# Patient Record
Sex: Female | Born: 1935 | Race: White | Hispanic: No | Smoking: Never smoker
Health system: Southern US, Community
[De-identification: ages and names within clinical notes are randomized; demographics above are authoritative.]

## PROBLEM LIST (undated history)

## (undated) DIAGNOSIS — R109 Unspecified abdominal pain: Secondary | ICD-10-CM

## (undated) DIAGNOSIS — K5730 Diverticulosis of large intestine without perforation or abscess without bleeding: Secondary | ICD-10-CM

## (undated) DIAGNOSIS — K648 Other hemorrhoids: Secondary | ICD-10-CM

## (undated) DIAGNOSIS — I10 Essential (primary) hypertension: Secondary | ICD-10-CM

## (undated) DIAGNOSIS — I341 Nonrheumatic mitral (valve) prolapse: Secondary | ICD-10-CM

## (undated) DIAGNOSIS — E785 Hyperlipidemia, unspecified: Secondary | ICD-10-CM

## (undated) DIAGNOSIS — K219 Gastro-esophageal reflux disease without esophagitis: Secondary | ICD-10-CM

## (undated) DIAGNOSIS — K449 Diaphragmatic hernia without obstruction or gangrene: Secondary | ICD-10-CM

## (undated) HISTORY — DX: Diaphragmatic hernia without obstruction or gangrene: K44.9

## (undated) HISTORY — DX: Essential (primary) hypertension: I10

## (undated) HISTORY — DX: Unspecified abdominal pain: R10.9

## (undated) HISTORY — PX: OVARY SURGERY: SHX727

## (undated) HISTORY — DX: Diverticulosis of large intestine without perforation or abscess without bleeding: K57.30

## (undated) HISTORY — DX: Hyperlipidemia, unspecified: E78.5

## (undated) HISTORY — PX: MELANOMA EXCISION: SHX5266

## (undated) HISTORY — DX: Other hemorrhoids: K64.8

## (undated) HISTORY — DX: Gastro-esophageal reflux disease without esophagitis: K21.9

## (undated) HISTORY — PX: TONSILLECTOMY: SUR1361

---

## 2010-06-29 DIAGNOSIS — K5730 Diverticulosis of large intestine without perforation or abscess without bleeding: Secondary | ICD-10-CM

## 2010-06-29 DIAGNOSIS — K219 Gastro-esophageal reflux disease without esophagitis: Secondary | ICD-10-CM

## 2010-06-29 DIAGNOSIS — K648 Other hemorrhoids: Secondary | ICD-10-CM

## 2010-06-29 DIAGNOSIS — K449 Diaphragmatic hernia without obstruction or gangrene: Secondary | ICD-10-CM

## 2010-06-29 HISTORY — DX: Diverticulosis of large intestine without perforation or abscess without bleeding: K57.30

## 2010-06-29 HISTORY — DX: Gastro-esophageal reflux disease without esophagitis: K21.9

## 2010-06-29 HISTORY — DX: Other hemorrhoids: K64.8

## 2010-06-29 HISTORY — DX: Diaphragmatic hernia without obstruction or gangrene: K44.9

## 2011-06-30 HISTORY — PX: CHOLECYSTECTOMY: SHX55

## 2012-02-04 ENCOUNTER — Emergency Department (HOSPITAL_BASED_OUTPATIENT_CLINIC_OR_DEPARTMENT_OTHER): Payer: Medicare Other

## 2012-02-04 ENCOUNTER — Emergency Department (HOSPITAL_BASED_OUTPATIENT_CLINIC_OR_DEPARTMENT_OTHER)
Admission: EM | Admit: 2012-02-04 | Discharge: 2012-02-04 | Disposition: A | Discharge status: Home / Self Care | Payer: Medicare Other | Attending: Emergency Medicine | Admitting: Emergency Medicine

## 2012-02-04 ENCOUNTER — Encounter (HOSPITAL_BASED_OUTPATIENT_CLINIC_OR_DEPARTMENT_OTHER): Payer: Self-pay | Admitting: *Deleted

## 2012-02-04 DIAGNOSIS — R350 Frequency of micturition: Secondary | ICD-10-CM | POA: Insufficient documentation

## 2012-02-04 DIAGNOSIS — R112 Nausea with vomiting, unspecified: Secondary | ICD-10-CM | POA: Insufficient documentation

## 2012-02-04 DIAGNOSIS — R109 Unspecified abdominal pain: Secondary | ICD-10-CM | POA: Insufficient documentation

## 2012-02-04 DIAGNOSIS — N390 Urinary tract infection, site not specified: Secondary | ICD-10-CM | POA: Insufficient documentation

## 2012-02-04 DIAGNOSIS — R1110 Vomiting, unspecified: Secondary | ICD-10-CM

## 2012-02-04 DIAGNOSIS — Z9089 Acquired absence of other organs: Secondary | ICD-10-CM | POA: Insufficient documentation

## 2012-02-04 DIAGNOSIS — R12 Heartburn: Secondary | ICD-10-CM | POA: Insufficient documentation

## 2012-02-04 DIAGNOSIS — R6883 Chills (without fever): Secondary | ICD-10-CM | POA: Insufficient documentation

## 2012-02-04 DIAGNOSIS — R10816 Epigastric abdominal tenderness: Secondary | ICD-10-CM | POA: Insufficient documentation

## 2012-02-04 HISTORY — DX: Nonrheumatic mitral (valve) prolapse: I34.1

## 2012-02-04 LAB — URINALYSIS, ROUTINE W REFLEX MICROSCOPIC
Bilirubin Urine: NEGATIVE
Nitrite: NEGATIVE
Specific Gravity, Urine: 1.019 (ref 1.005–1.030)
Urobilinogen, UA: 0.2 mg/dL (ref 0.0–1.0)

## 2012-02-04 LAB — COMPREHENSIVE METABOLIC PANEL
Alkaline Phosphatase: 54 U/L (ref 39–117)
BUN: 36 mg/dL — ABNORMAL HIGH (ref 6–23)
Calcium: 11.1 mg/dL — ABNORMAL HIGH (ref 8.4–10.5)
Creatinine, Ser: 1.3 mg/dL — ABNORMAL HIGH (ref 0.50–1.10)
GFR calc Af Amer: 43 mL/min — ABNORMAL LOW (ref >90)
Glucose, Bld: 151 mg/dL — ABNORMAL HIGH (ref 70–99)
Total Protein: 7.5 g/dL (ref 6.0–8.3)

## 2012-02-04 LAB — CBC WITH DIFFERENTIAL/PLATELET
Basophils Relative: 0 % (ref 0–1)
Eosinophils Absolute: 0.1 10*3/uL (ref 0.0–0.7)
Eosinophils Relative: 1 % (ref 0–5)
Hemoglobin: 14.4 g/dL (ref 12.0–15.0)
Lymphs Abs: 1.8 10*3/uL (ref 0.7–4.0)
MCH: 31.6 pg (ref 26.0–34.0)
MCHC: 36.3 g/dL — ABNORMAL HIGH (ref 30.0–36.0)
MCV: 87.1 fL (ref 78.0–100.0)
Monocytes Relative: 6 % (ref 3–12)
RBC: 4.56 MIL/uL (ref 3.87–5.11)

## 2012-02-04 LAB — URINE MICROSCOPIC-ADD ON

## 2012-02-04 LAB — LIPASE, BLOOD: Lipase: 26 U/L (ref 11–59)

## 2012-02-04 MED ORDER — PANTOPRAZOLE SODIUM 40 MG IV SOLR
40.0000 mg | Freq: Once | INTRAVENOUS | Status: AC
  Administered 2012-02-04: 40 mg via INTRAVENOUS
  Filled 2012-02-04: qty 40

## 2012-02-04 MED ORDER — CIPROFLOXACIN HCL 500 MG PO TABS: 500.0000 mg | ORAL_TABLET | Freq: Two times a day (BID) | ORAL | Status: AC

## 2012-02-04 MED ORDER — ONDANSETRON 4 MG PO TBDP: 4.0000 mg | ORAL_TABLET | Freq: Once | ORAL | Status: DC

## 2012-02-04 MED ORDER — ONDANSETRON 8 MG PO TBDP: 8.0000 mg | ORAL_TABLET | Freq: Three times a day (TID) | ORAL | Status: AC | PRN

## 2012-02-04 MED ORDER — ONDANSETRON HCL 4 MG/2ML IJ SOLN
4.0000 mg | Freq: Once | INTRAMUSCULAR | Status: AC
  Administered 2012-02-04: 4 mg via INTRAVENOUS

## 2012-02-04 MED ORDER — CIPROFLOXACIN IN D5W 400 MG/200ML IV SOLN
400.0000 mg | Freq: Once | INTRAVENOUS | Status: AC
  Administered 2012-02-04: 400 mg via INTRAVENOUS
  Filled 2012-02-04: qty 200

## 2012-02-04 MED ORDER — ONDANSETRON HCL 4 MG/2ML IJ SOLN
INTRAMUSCULAR | Status: AC
  Administered 2012-02-04: 4 mg via INTRAVENOUS
  Filled 2012-02-04: qty 2

## 2012-02-04 NOTE — ED Notes (Signed)
Pt. Reports mid abd. Pain and started vomiting today at 3pm.  Pt. Reports eating chinese food last night.

## 2012-02-04 NOTE — ED Notes (Signed)
Pt in XR at this time

## 2012-02-04 NOTE — ED Provider Notes (Signed)
Elderly female with a history of intermittent episodes of abdominal pain with nausea and vomiting who presents with similar symptoms. She denies any fevers chills coughing diarrhea rashes or swelling. On exam the patient is a soft abdomen which is minimally tender, moist mucous membranes after getting IV fluids and anti-emetics. Review of the patient's vital signs shows no fever, no significant tachycardia or hypotension. Her imaging reveals a very early ileus, the patient is a symptomatically medications, has a urinary tract infection and desires to be discharged rather than being admitted when offered. She appears stable for discharge and appears responsible and has a family member that is with her that can bring her back should her symptoms worsen.  Medical screening examination/treatment/procedure(s) were conducted as a shared visit with non-physician practitioner(s) and myself.  I personally evaluated the patient during the encounter    Vida Roller, MD 02/04/12 2107

## 2012-02-04 NOTE — ED Provider Notes (Signed)
History     CSN: [id number]  Arrival date & time 02/04/12  1802   First MD Initiated Contact with Patient 02/04/12 1841      Chief Complaint  Patient presents with  . Abdominal Pain    Pt. c/o mid abd. pain starting approx. this am and started vomiting at 3pm today.  No reports of diarrhea.  Pt. reports constant vomiting since 3pm    (Consider location/radiation/quality/duration/timing/severity/associated sxs/prior treatment) Patient is a 78 y.o. female presenting with abdominal pain. The history is provided by the patient.  Abdominal Pain The primary symptoms of the illness include abdominal pain, nausea and vomiting. The primary symptoms of the illness do not include fever, diarrhea, hematemesis or dysuria. The current episode started 6 to 12 hours ago. The onset of the illness was sudden. The problem has not changed since onset. Additional symptoms associated with the illness include chills, heartburn and frequency. Symptoms associated with the illness do not include anorexia, urgency or back pain.  Pt states epigastric abdominal pain onset this morning. Nusea, persistent vomiting starting around 3pm. Pt states hx of the same. Has been having these problems for "years" with multiple hospitalizations and tests including CT scans, MRIs, labs. States last hospitalized in may of this year. States usually comes in for 4 days for IV fluids and anti emetics. Denies fever, chills, diarrhea. No chest pain or SOB.    Past Medical History  Diagnosis Date  . Mitral prolapse     Past Surgical History  Procedure Date  . Melanoma excision   . Cholecystectomy   . Ovary surgery     No family history on file.  History  Substance Use Topics  . Smoking status: Not on file  . Smokeless tobacco: Not on file  . Alcohol Use:     OB History    Grav Para Term Preterm Abortions TAB SAB Ect Mult Living                  Review of Systems  Constitutional: Positive for chills. Negative for  fever.  HENT: Negative for neck pain and neck stiffness.   Respiratory: Negative.   Cardiovascular: Negative.   Gastrointestinal: Positive for heartburn, nausea, vomiting and abdominal pain. Negative for diarrhea, blood in stool, anorexia and hematemesis.  Genitourinary: Positive for frequency. Negative for dysuria and urgency.  Musculoskeletal: Negative for back pain.  Skin: Negative.   Neurological: Negative for dizziness, weakness and numbness.    Allergies  Penicillins and Sulfa antibiotics  Home Medications   Current Outpatient Rx  Name Route Sig Dispense Refill  . ATORVASTATIN CALCIUM 10 MG PO TABS Oral Take 5 mg by mouth.    Marland Kitchen HYDROCHLOROTHIAZIDE 25 MG PO TABS Oral Take 25 mg by mouth daily.    Marland Kitchen LISINOPRIL 10 MG PO TABS Oral Take 10 mg by mouth daily.    Marland Kitchen METOPROLOL TARTRATE 25 MG PO TABS Oral Take 25 mg by mouth 2 (two) times daily.      BP 134/59  Pulse 102  Temp 98 F (36.7 C) (Oral)  Resp 18  SpO2 100%  Physical Exam  Nursing note and vitals reviewed. Constitutional: She is oriented to person, place, and time. She appears well-developed and well-nourished. No distress.  HENT:  Head: Normocephalic.  Eyes: Conjunctivae are normal.  Neck: Normal range of motion. Neck supple.  Cardiovascular: Normal rate, regular rhythm and normal heart sounds.   Pulmonary/Chest: Effort normal and breath sounds normal. No respiratory distress. She has no  wheezes.  Abdominal: Soft. Bowel sounds are normal.       Epigastric tenderness. No guarding  Musculoskeletal: She exhibits no edema.  Neurological: She is alert and oriented to person, place, and time.  Skin: Skin is warm and dry.  Psychiatric: She has a normal mood and affect.    ED Course  Procedures (including critical care time) Pt with similar symptoms in the past. Fluids started. Labs pending. ECG ordered. Zofran given by a nurse, she is feeling better currently. Will monitor.   Results for orders placed during the  hospital encounter of 02/04/12  CBC WITH DIFFERENTIAL      Component Value Range   WBC 12.7 (*) 4.0 - 10.5 K/uL   RBC 4.56  3.87 - 5.11 MIL/uL   Hemoglobin 14.4  12.0 - 15.0 g/dL   HCT 08.6  57.8 - 46.9 %   MCV 87.1  78.0 - 100.0 fL   MCH 31.6  26.0 - 34.0 pg   MCHC 36.3 (*) 30.0 - 36.0 g/dL   RDW 62.9  52.8 - 41.3 %   Platelets 311  150 - 400 K/uL   Neutrophils Relative 79 (*) 43 - 77 %   Neutro Abs 10.0 (*) 1.7 - 7.7 K/uL   Lymphocytes Relative 15  12 - 46 %   Lymphs Abs 1.8  0.7 - 4.0 K/uL   Monocytes Relative 6  3 - 12 %   Monocytes Absolute 0.8  0.1 - 1.0 K/uL   Eosinophils Relative 1  0 - 5 %   Eosinophils Absolute 0.1  0.0 - 0.7 K/uL   Basophils Relative 0  0 - 1 %   Basophils Absolute 0.0  0.0 - 0.1 K/uL  COMPREHENSIVE METABOLIC PANEL      Component Value Range   Sodium 138  135 - 145 mEq/L   Potassium 4.0  3.5 - 5.1 mEq/L   Chloride 94 (*) 96 - 112 mEq/L   CO2 32  19 - 32 mEq/L   Glucose, Bld 151 (*) 70 - 99 mg/dL   BUN 36 (*) 6 - 23 mg/dL   Creatinine, Ser 2.44 (*) 0.50 - 1.10 mg/dL   Calcium 01.0 (*) 8.4 - 10.5 mg/dL   Total Protein 7.5  6.0 - 8.3 g/dL   Albumin 4.7  3.5 - 5.2 g/dL   AST 17  0 - 37 U/L   ALT 8  0 - 35 U/L   Alkaline Phosphatase 54  39 - 117 U/L   Total Bilirubin 0.5  0.3 - 1.2 mg/dL   GFR calc non Af Amer 37 (*) >90 mL/min   GFR calc Af Amer 43 (*) >90 mL/min  LIPASE, BLOOD      Component Value Range   Lipase 26  11 - 59 U/L  URINALYSIS, ROUTINE W REFLEX MICROSCOPIC      Component Value Range   Color, Urine YELLOW  YELLOW   APPearance CLOUDY (*) CLEAR   Specific Gravity, Urine 1.019  1.005 - 1.030   pH 7.5  5.0 - 8.0   Glucose, UA NEGATIVE  NEGATIVE mg/dL   Hgb urine dipstick NEGATIVE  NEGATIVE   Bilirubin Urine NEGATIVE  NEGATIVE   Ketones, ur NEGATIVE  NEGATIVE mg/dL   Protein, ur NEGATIVE  NEGATIVE mg/dL   Urobilinogen, UA 0.2  0.0 - 1.0 mg/dL   Nitrite NEGATIVE  NEGATIVE   Leukocytes, UA SMALL (*) NEGATIVE  TROPONIN I       Component Value Range   Troponin  I <0.30  <0.30 ng/mL  URINE MICROSCOPIC-ADD ON      Component Value Range   Squamous Epithelial / LPF RARE  RARE   WBC, UA 7-10  <3 WBC/hpf   Bacteria, UA MANY (*) RARE   Dg Abd Acute W/chest  02/04/2012  *RADIOLOGY REPORT*  Clinical Data: Vomiting.  Previous cholecystectomy.  ACUTE ABDOMEN SERIES (ABDOMEN 2 VIEW & CHEST 1 VIEW)  Comparison: None.  Findings: Normal sized heart.  Clear lungs.  Mild diffuse peribronchial thickening.  Borderline dilated small bowel loops.  No free peritoneal air. Lower lumbar spine degenerative changes.  IMPRESSION:  1.  Minimal small bowel ileus or very early partial obstruction. 2.  Mild chronic bronchitic changes.  Original Report Authenticated By: Darrol Angel, M.D.    Date: 02/04/2012  Rate: 87  Rhythm: normal sinus rhythm  QRS Axis: normal  Intervals: normal  ST/T Wave abnormalities: normal  Conduction Disutrbances:none  Narrative Interpretation: Low voltage QRS, flattened T waves  Old EKG Reviewed: none available   Pt feeling better. Wanting to go home. Cipro given for UTI, penicillin allergy. Discussed with dr. Hyacinth Meeker, d/c, return if worsening.    No results found.   1. UTI (lower urinary tract infection)   2. Abdominal pain   3. Vomiting       MDM  Pt with abdominal pain and vomiting onset today. Similar episodes in the past, evaluated in [place]. Labs obtained, x-ray to r/o obstruction. Zofran for nausea, protonix for reflux symptoms. Pt has uti, cipro given, pt no longer having vomiting. Wanting to go home. Pt does have possible illeus vs partial SBO. Pt does not want to stay and her symptoms improved. Will d/c home, however, told to return if worsening.        Lottie Mussel, PA 02/04/12 2211  Lottie Mussel, PA 02/04/12 2231

## 2012-02-05 LAB — URINE CULTURE

## 2012-02-05 NOTE — ED Provider Notes (Signed)
Medical screening examination/treatment/procedure(s) were conducted as a shared visit with non-physician practitioner(s) and myself.  I personally evaluated the patient during the encounter  Please see my separate respective documentation pertaining to this patient encounter   Vida Roller, MD 02/05/12 [phone]

## 2012-05-09 ENCOUNTER — Encounter: Payer: Self-pay | Admitting: *Deleted

## 2012-05-09 DIAGNOSIS — I341 Nonrheumatic mitral (valve) prolapse: Secondary | ICD-10-CM | POA: Insufficient documentation

## 2012-05-10 ENCOUNTER — Encounter: Payer: Self-pay | Admitting: Cardiology

## 2012-05-10 ENCOUNTER — Encounter: Payer: Self-pay | Admitting: *Deleted

## 2012-05-10 DIAGNOSIS — R109 Unspecified abdominal pain: Secondary | ICD-10-CM | POA: Insufficient documentation

## 2012-05-10 DIAGNOSIS — N390 Urinary tract infection, site not specified: Secondary | ICD-10-CM | POA: Insufficient documentation

## 2012-05-11 ENCOUNTER — Ambulatory Visit (INDEPENDENT_AMBULATORY_CARE_PROVIDER_SITE_OTHER): Payer: Medicare Other | Admitting: Cardiology

## 2012-05-11 ENCOUNTER — Encounter: Payer: Self-pay | Admitting: Cardiology

## 2012-05-11 VITALS — BP 126/80 | HR 76 | Wt 124.0 lb

## 2012-05-11 DIAGNOSIS — R002 Palpitations: Secondary | ICD-10-CM

## 2012-05-11 DIAGNOSIS — I341 Nonrheumatic mitral (valve) prolapse: Secondary | ICD-10-CM

## 2012-05-11 DIAGNOSIS — I059 Rheumatic mitral valve disease, unspecified: Secondary | ICD-10-CM

## 2012-05-11 DIAGNOSIS — I10 Essential (primary) hypertension: Secondary | ICD-10-CM

## 2012-05-11 NOTE — Patient Instructions (Addendum)
Your physician recommends that you schedule a follow-up appointment in: as needed  

## 2012-05-11 NOTE — Assessment & Plan Note (Signed)
Patient feels occasional skips but no sustained palpitations. Her symptoms appear to be reasonably well controlled with beta-blockade. Will continue. Note previous echocardiogram showed normal LV function and previous stress echocardiogram was normal.

## 2012-05-11 NOTE — Assessment & Plan Note (Signed)
Blood pressure is controlled. Continue present medications. 

## 2012-05-11 NOTE — Progress Notes (Signed)
  HPI: 78 year old female for evaluation of hypertension. Patient recently moved to this area from [place]. She was seen there by cardiology for history of palpitations secondary to PVCs, question history of mitral valve prolapse as well as hypertension. She did bring records today. She had a stress echocardiogram last in January of 2009 that was normal. An echocardiogram from October of 2007 showed normal LV function and no significant mitral valve prolapse. There was trace mitral regurgitation. Patient denies dyspnea on exertion, orthopnea, PND, pedal edema, syncope or chest pain. She occasionally feels brief skips but no sustained palpitations.  Current Outpatient Prescriptions  Medication Sig Dispense Refill  . atorvastatin (LIPITOR) 10 MG tablet Take 5 mg by mouth. Patient uses 5 mg of this medication.      . hydrochlorothiazide (HYDRODIURIL) 25 MG tablet Take 25 mg by mouth daily.      Marland Kitchen lisinopril (PRINIVIL,ZESTRIL) 10 MG tablet Take 10 mg by mouth daily.      . metoprolol tartrate (LOPRESSOR) 25 MG tablet Take 25 mg by mouth daily.         Allergies  Allergen Reactions  . Penicillins Anaphylaxis  . Sulfa Antibiotics Rash    Past Medical History  Diagnosis Date  . Mitral prolapse   . Abdominal pain   . Hypertension   . Hyperlipidemia   . GERD (gastroesophageal reflux disease)     Past Surgical History  Procedure Date  . Melanoma excision   . Cholecystectomy   . Ovary surgery   . Tonsillectomy     History   Social History  . Marital Status: Widowed    Spouse Name: N/A    Number of Children: 3  . Years of Education: N/A   Occupational History  . Not on file.   Social History Main Topics  . Smoking status: Never Smoker   . Smokeless tobacco: Not on file  . Alcohol Use: Yes     Comment: Occasional glass of wine  . Drug Use: No  . Sexually Active: Not on file   Other Topics Concern  . Not on file   Social History Narrative  . No narrative on file     Family History  Problem Relation Age of Onset  . Heart disease      No family history    ROS: knee arthralgias but no fevers or chills, productive cough, hemoptysis, dysphasia, odynophagia, melena, hematochezia, dysuria, hematuria, rash, seizure activity, orthopnea, PND, pedal edema, claudication. Remaining systems are negative.  Physical Exam:   Blood pressure 126/80, pulse 76, weight 124 lb (56.246 kg).  General:  Well developed/well nourished in NAD Skin warm/dry Patient not depressed No peripheral clubbing Back-normal HEENT-normal/normal eyelids Neck supple/normal carotid upstroke bilaterally; no bruits; no JVD; no thyromegaly chest - CTA/ normal expansion CV - RRR/normal S1 and S2; no murmurs, rubs or gallops;  PMI nondisplaced Abdomen -NT/ND, no HSM, no mass, + bowel sounds, no bruit 2+ femoral pulses, no bruits Ext-no edema, chords, 2+ DP Neuro-grossly nonfocal  ECG sinus rhythm at a rate of 76. No ST changes. Low voltage.

## 2012-05-11 NOTE — Assessment & Plan Note (Signed)
No evidence on most recent echocardiogram. 

## 2012-05-18 ENCOUNTER — Encounter: Payer: Self-pay | Admitting: Cardiology

## 2012-12-24 ENCOUNTER — Encounter (HOSPITAL_BASED_OUTPATIENT_CLINIC_OR_DEPARTMENT_OTHER): Payer: Self-pay | Admitting: *Deleted

## 2012-12-24 ENCOUNTER — Emergency Department (HOSPITAL_BASED_OUTPATIENT_CLINIC_OR_DEPARTMENT_OTHER)
Admission: EM | Admit: 2012-12-24 | Discharge: 2012-12-24 | Disposition: A | Discharge status: Home / Self Care | Payer: Medicare Other | Attending: Emergency Medicine | Admitting: Emergency Medicine

## 2012-12-24 ENCOUNTER — Emergency Department (HOSPITAL_BASED_OUTPATIENT_CLINIC_OR_DEPARTMENT_OTHER): Payer: Medicare Other

## 2012-12-24 DIAGNOSIS — E785 Hyperlipidemia, unspecified: Secondary | ICD-10-CM | POA: Insufficient documentation

## 2012-12-24 DIAGNOSIS — Z79899 Other long term (current) drug therapy: Secondary | ICD-10-CM | POA: Insufficient documentation

## 2012-12-24 DIAGNOSIS — R112 Nausea with vomiting, unspecified: Secondary | ICD-10-CM | POA: Insufficient documentation

## 2012-12-24 DIAGNOSIS — Z8679 Personal history of other diseases of the circulatory system: Secondary | ICD-10-CM | POA: Insufficient documentation

## 2012-12-24 DIAGNOSIS — Z880 Allergy status to penicillin: Secondary | ICD-10-CM | POA: Insufficient documentation

## 2012-12-24 DIAGNOSIS — R011 Cardiac murmur, unspecified: Secondary | ICD-10-CM | POA: Insufficient documentation

## 2012-12-24 DIAGNOSIS — Z9089 Acquired absence of other organs: Secondary | ICD-10-CM | POA: Insufficient documentation

## 2012-12-24 DIAGNOSIS — I10 Essential (primary) hypertension: Secondary | ICD-10-CM | POA: Insufficient documentation

## 2012-12-24 DIAGNOSIS — Z9889 Other specified postprocedural states: Secondary | ICD-10-CM | POA: Insufficient documentation

## 2012-12-24 DIAGNOSIS — Z8719 Personal history of other diseases of the digestive system: Secondary | ICD-10-CM | POA: Insufficient documentation

## 2012-12-24 DIAGNOSIS — R6883 Chills (without fever): Secondary | ICD-10-CM | POA: Insufficient documentation

## 2012-12-24 LAB — URINALYSIS, ROUTINE W REFLEX MICROSCOPIC
Bilirubin Urine: NEGATIVE
Ketones, ur: NEGATIVE mg/dL
Nitrite: NEGATIVE
Specific Gravity, Urine: 1.013 (ref 1.005–1.030)
Urobilinogen, UA: 0.2 mg/dL (ref 0.0–1.0)

## 2012-12-24 LAB — CBC WITH DIFFERENTIAL/PLATELET
Basophils Relative: 0 % (ref 0–1)
Eosinophils Absolute: 0.1 10*3/uL (ref 0.0–0.7)
MCH: 31.9 pg (ref 26.0–34.0)
MCHC: 35.5 g/dL (ref 30.0–36.0)
Neutrophils Relative %: 71 % (ref 43–77)
Platelets: 244 10*3/uL (ref 150–400)
RBC: 3.89 MIL/uL (ref 3.87–5.11)

## 2012-12-24 LAB — COMPREHENSIVE METABOLIC PANEL
ALT: 13 U/L (ref 0–35)
Albumin: 3.9 g/dL (ref 3.5–5.2)
Alkaline Phosphatase: 56 U/L (ref 39–117)
Potassium: 4.1 mEq/L (ref 3.5–5.1)
Sodium: 137 mEq/L (ref 135–145)
Total Protein: 6.7 g/dL (ref 6.0–8.3)

## 2012-12-24 LAB — URINE MICROSCOPIC-ADD ON

## 2012-12-24 MED ORDER — ONDANSETRON 8 MG PO TBDP: 8.0000 mg | ORAL_TABLET | Freq: Three times a day (TID) | ORAL | Status: DC | PRN

## 2012-12-24 NOTE — ED Notes (Signed)
MD at bedside. 

## 2012-12-24 NOTE — ED Notes (Signed)
Pt states that every time she eats something solid, her stomach hurts. She walks around and massages her stomach and it goes away. This has been going on for 4-5 days.

## 2012-12-24 NOTE — ED Provider Notes (Signed)
History    This chart was scribed for Heather Quarry, MD by Quintella Reichert, ED scribe.  This patient was seen in room MH07/MH07 and the patient's care was started at 3:28 PM.   CSN: [id number]  Arrival date & time 12/24/12  1455     Chief Complaint  Patient presents with  . Abdominal Pain    Patient is a 79 y.o. female presenting with abdominal pain. The history is provided by the patient. No language interpreter was used.  Abdominal Pain This is a recurrent problem. The current episode started more than 2 days ago. Episode frequency: Every time pt eats solid foods. The problem has not changed since onset.Associated symptoms include abdominal pain. Pertinent negatives include no chest pain, no headaches and no shortness of breath. The symptoms are aggravated by eating (solid foods). Relieved by: Walking and rubbing stomach.    HPI Comments: Heather Bender is a 79 y.o. female with h/o recurrent abdominal pain who presents to the Emergency Department complaining of 4 days of episodic severe upper abdominal pain brought on by eating solid foods.  Pain is described as "hot" and causes her to double up, and is accompanied by chills.  It is relieved by walking and massaging her stomach.  It is not brought on by liquids.  Pt has h/o similar episodes intermittently for 7 years and in 2012 received an abdominal US that revealed gallstones that were treated by cholecystectomy.  This is the 3rd time she has experienced symptoms since her surgery.  She has not received an [place] since that time.  Pt notes that she normally has nausea and emesis during episodes of pain but this time she does not have either.  She notes that her BMs recently have been solid small fragments but she denies diarrhea, constipation or other bowel symptoms.  She denies dysuria, difficulty urinating, urinary frequency, rash, joint swelling, or any other associated symptoms.   PCP is Dr. Alberteen Sam   Past Medical History   Diagnosis Date  . Mitral prolapse   . Abdominal pain   . Hypertension   . Hyperlipidemia   . GERD (gastroesophageal reflux disease)    Past Surgical History  Procedure Laterality Date  . Melanoma excision    . Cholecystectomy    . Ovary surgery    . Tonsillectomy     Family History  Problem Relation Age of Onset  . Heart disease      No family history  . Cancer Father     Kidney  . Diabetes Sister   . Diabetes Maternal Aunt   . Cancer Maternal Grandmother     Colon Cancer   History  Substance Use Topics  . Smoking status: Never Smoker   . Smokeless tobacco: Not on file  . Alcohol Use: Yes     Comment: Occasional glass of wine   OB History   Grav Para Term Preterm Abortions TAB SAB Ect Mult Living                   Review of Systems  Constitutional: Positive for chills.  Respiratory: Negative for shortness of breath.   Cardiovascular: Negative for chest pain.  Gastrointestinal: Positive for abdominal pain. Negative for nausea, vomiting, diarrhea and constipation.  Genitourinary: Negative for dysuria, frequency and difficulty urinating.  Musculoskeletal: Negative for joint swelling.  Skin: Negative for rash.  Neurological: Negative for headaches.  All other systems reviewed and are negative.      Allergies  Penicillins  and Sulfa antibiotics  Home Medications   Current Outpatient Rx  Name  Route  Sig  Dispense  Refill  . lisinopril-hydrochlorothiazide (PRINZIDE,ZESTORETIC) 20-12.5 MG per tablet   Oral   Take 1 tablet by mouth daily.         Marland Kitchen atorvastatin (LIPITOR) 10 MG tablet   Oral   Take 5 mg by mouth. Patient uses 5 mg of this medication.         . hydrochlorothiazide (HYDRODIURIL) 25 MG tablet   Oral   Take 25 mg by mouth daily.         Marland Kitchen lisinopril (PRINIVIL,ZESTRIL) 10 MG tablet   Oral   Take 10 mg by mouth daily.         . metoprolol tartrate (LOPRESSOR) 25 MG tablet   Oral   Take 25 mg by mouth daily.           BP  117/55  Pulse 85  Temp(Src) 98.3 F (36.8 C) (Oral)  Resp 18  Ht 5\' 3"  (1.6 m)  Wt 120 lb (54.432 kg)  BMI 21.26 kg/m2  SpO2 100%  Physical Exam  Nursing note and vitals reviewed. Constitutional: She is oriented to person, place, and time. She appears well-developed and well-nourished.  HENT:  Head: Normocephalic and atraumatic.  Right Ear: External ear normal.  Left Ear: External ear normal.  Nose: Nose normal.  Mouth/Throat: Oropharynx is clear and moist.  Eyes: Conjunctivae and EOM are normal. Pupils are equal, round, and reactive to light.  Neck: Normal range of motion. Neck supple.  Cardiovascular: Normal rate, regular rhythm and intact distal pulses.   HR 72 and regular 2/6 systolic murmur  Pulmonary/Chest: Effort normal and breath sounds normal.  Lungs clear to auscultation, good air movement  Abdominal: Soft. Bowel sounds are normal. She exhibits no distension and no mass. There is no tenderness. There is no rebound and no guarding.  No organomegaly  Genitourinary:  No CVA tenderness  Musculoskeletal: Normal range of motion.  Neurological: She is alert and oriented to person, place, and time. She has normal reflexes.  Skin: Skin is warm and dry.  Psychiatric: She has a normal mood and affect. Her behavior is normal. Judgment and thought content normal.    ED Course  Procedures (including critical care time)  DIAGNOSTIC STUDIES: Oxygen Saturation is 100% on room air, normal by my interpretation.    COORDINATION OF CARE: 3:37 PM-Discussed treatment plan which includes abdominal US, UA and labs with pt at bedside and pt agreed to plan.      Labs Reviewed  URINALYSIS, ROUTINE W REFLEX MICROSCOPIC - Abnormal; Notable for the following:    Leukocytes, UA TRACE (*)    All other components within normal limits  CBC WITH DIFFERENTIAL - Abnormal; Notable for the following:    HCT 34.9 (*)    All other components within normal limits  COMPREHENSIVE METABOLIC PANEL  - Abnormal; Notable for the following:    Glucose, Bld 145 (*)    BUN 32 (*)    Creatinine, Ser 1.30 (*)    GFR calc non Af Amer 37 (*)    GFR calc Af Amer 43 (*)    All other components within normal limits  URINE MICROSCOPIC-ADD ON - Abnormal; Notable for the following:    Bacteria, UA FEW (*)    Casts HYALINE CASTS (*)    All other components within normal limits  URINE CULTURE  LIPASE, BLOOD   US Abdomen Complete  12/24/2012   *  RADIOLOGY REPORT*  Clinical Data:  The abdominal pain.  Cholecystectomy.  COMPLETE ABDOMINAL ULTRASOUND  Comparison:  None.  Findings:  Gallbladder:  Surgically absent  Common bile duct:  Normal at 2.4 mm.  Liver:  Normal without focal lesions or biliary ductal dilatation.  IVC:  Only partially visualized.  Normal as seen.  Pancreas:  Poorly seen because of overlying bowel gas.  Spleen:  Normal at 8 cm.  Right Kidney:  10.3 cm in length.  1 cm cyst in the midportion.  No evidence of mass, stone or hydronephrosis.  Left Kidney:  10.2 cm in length.  No cyst, mass, stone or hydronephrosis.  Abdominal aorta:  No aneurysm  No ascites  IMPRESSION: Previous cholecystectomy.  No acute or significant finding.   Original Report Authenticated By: Paulina Fusi, M.D.    No diagnosis found. I personally performed the services described in this documentation, which was scribed in my presence. The recorded information has been reviewed and considered.  MDM  No evidence of retained stone us,pancreatitis.  Patient with possible gastritis.   Patient could have mesenteric ischemia but is currently no symptomatic here, no bleeding.  Patient advised clear liquids and close follow up.   Heather Quarry, MD 12/26/12 [phone]

## 2012-12-24 NOTE — ED Notes (Signed)
Patient transported to Ultrasound 

## 2012-12-25 LAB — URINE CULTURE

## 2012-12-26 ENCOUNTER — Ambulatory Visit (INDEPENDENT_AMBULATORY_CARE_PROVIDER_SITE_OTHER): Payer: Medicare Other | Admitting: Family Medicine

## 2012-12-26 ENCOUNTER — Telehealth: Payer: Self-pay | Admitting: Family Medicine

## 2012-12-26 ENCOUNTER — Encounter: Payer: Self-pay | Admitting: Family Medicine

## 2012-12-26 VITALS — BP 110/70 | HR 76 | Wt 122.0 lb

## 2012-12-26 DIAGNOSIS — R1013 Epigastric pain: Secondary | ICD-10-CM

## 2012-12-26 MED ORDER — PANTOPRAZOLE SODIUM 40 MG PO TBEC: 40.0000 mg | DELAYED_RELEASE_TABLET | Freq: Every day | ORAL | Status: DC

## 2012-12-26 NOTE — Telephone Encounter (Signed)
Error

## 2012-12-26 NOTE — Progress Notes (Signed)
  Subjective:    Patient ID: Heather Bender, female    DOB: 09-Sep-1926, 79 y.o.   MRN: [id number]  HPI  Heather Bender is here today to discuss her recent visit to the ED here at the [facility] on 12/25/12.  She was having severe abdominal pain in her upper abdomen and decided to seek medical care.  They did an U/S that was normal.  She was instructed to follow up with her PCP.  She feels that solid foods cause her to have this pain.  She has been on a liquid diet and her symptoms have not worsened.  She takes Prevacid OTC which has helped her some.     Review of Systems  Constitutional: Negative for fatigue.  Respiratory: Negative for cough and chest tightness.   Cardiovascular: Negative for chest pain.  Gastrointestinal: Positive for nausea and abdominal pain. Negative for vomiting, diarrhea, constipation and abdominal distention.  Genitourinary: Negative for difficulty urinating.   Past Medical History  Diagnosis Date  . Mitral prolapse   . Abdominal pain   . Hypertension   . Hyperlipidemia   . GERD (gastroesophageal reflux disease)    Family History  Problem Relation Age of Onset  . Heart disease      No family history  . Cancer Father     Kidney  . Diabetes Sister   . Diabetes Maternal Aunt   . Cancer Maternal Grandmother     Colon Cancer   History   Social History Narrative   Marital Status: Widowed    Children:  Daughter Olegario Messier)  Sons Jeannett Senior and [place])    Pets: Cat (1)    Living Situation: Lives with her daughter Mariann Barter)    Occupation: Retired    Education: [employer]    Tobacco Use/Exposure:  None    Alcohol Use:  Occasional   Drug Use:  None   Diet:  Regular   Exercise:  None   Hobbies: [employer] and reading.                     Objective:   Physical Exam  Constitutional: No distress.  HENT:  Nose: Nose normal.  Mouth/Throat: Oropharynx is clear and moist. Mucous membranes are dry.  Eyes: No scleral icterus.  Neck:  Neck supple.  Cardiovascular: Normal rate, regular rhythm and normal heart sounds.   Pulmonary/Chest: Effort normal and breath sounds normal.  Abdominal: Soft. Bowel sounds are normal. She exhibits no distension and no mass. There is tenderness. There is no rebound and no guarding.  Musculoskeletal: She exhibits no edema.  Lymphadenopathy:    She has no cervical adenopathy.  Neurological: She is alert.  Skin: Skin is warm and dry. She is not diaphoretic.  Psychiatric: She has a normal mood and affect. Her behavior is normal. Judgment and thought content normal.          Assessment & Plan:

## 2012-12-26 NOTE — Patient Instructions (Addendum)
1)  Abdominal Pain - ? Whether this is GERD vs Crohn's after reading the small bowel study.  We are going to get you to a gastroenterologist to see what they think about your symptoms. In the meantime,  I would take Protonix daily.  If you have extra pain, you can try some GI cocktail.    Crohn's Disease Crohn's disease is a long-term (chronic) soreness and redness (inflammation) of the intestines (bowel). It can affect any portion of the digestive tract, from the mouth to the anus. It can also cause problems outside the digestive tract. Crohn's disease is closely related to a disease called ulcerative colitis (together, these two diseases are called inflammatory bowel disease).  CAUSES  The cause of Crohn's disease is not known. One Nelva Bush is that, in an easily affected (susceptible) person, the immune system is triggered to attack the body's own digestive tissue. Crohn's disease runs in families. It seems to be more common in certain geographic areas and amongst certain races. There are no clear-cut dietary causes.  SYMPTOMS  Crohn's disease can cause many different symptoms since it can affect many different parts of the body. Symptoms include:  Fatigue.  Weight loss.  Chronic diarrhea, sometime bloody.  Abdominal pain and cramps.  Fever.  Ulcers or canker sores in the mouth or rectum.  Anemia (low red blood cells).  Arthritis, skin problems, and eye problems may occur. Complications of Crohn's disease can include:  Series of holes (perforation) of the bowel.  Portions of the intestines sticking to each other (adhesions).  Obstruction of the bowel.  Fistula formation, typically in the rectal area but also in other areas. A fistula is an opening between the bowels and the outside, or between the bowels and another organ.  A painful crack in the mucous membrane of the anus (rectal fissure). DIAGNOSIS  Your caregiver may suspect Crohn's disease based on your symptoms and an exam.  Blood tests may confirm that there is a problem. You may be asked to submit a stool specimen for examination. X-rays and CT scans may be necessary. Ultimately, the diagnosis is usually made after a procedure that uses a flexible tube that is inserted via your mouth or your anus. This is done under sedation and is called either an upper endoscopy or colonoscopy. With these tests, the specialist can take tiny tissue samples and remove them from the inside of the bowel (biopsy). Examination of this biopsy tissue under a microscope can reveal Crohn's disease as the cause of your symptoms. Due to the many different forms that Crohn's disease can take, symptoms may be present for several years before a diagnosis is made. HOME CARE INSTRUCTIONS   There is no cure for Crohn's disease. The best treatment is frequent checkups with your caregiver.  Symptoms such as diarrhea can be controlled with medications. Avoid foods that have a laxative effect such as fresh fruit, vegetables and dairy products. During flare ups, you can rest your bowel by refraining from solid foods. Drink clear liquids frequently during the day (electrolyte or re-hydrating fluids are best. Your caregiver can help you with suggestions). Drink often to prevent loss of body fluids (dehydration). When diarrhea has cleared, eat small meals and more frequently. Avoid food additives and stimulants such as caffeine (coffee, tea, or chocolate). Enzyme supplements may help if you develop intolerance to a sugar in dairy products (lactose). Ask your caregiver or dietitian about specific dietary instructions.  Try to maintain a positive attitude. Learn relaxation techniques  such as self hypnosis, mental imaging, and muscle relaxation.  If possible, avoid stresses which can aggravate your condition.  Exercise regularly.  Follow your diet.  Always get plenty of rest. SEEK MEDICAL CARE IF:   Your symptoms fail to improve after a week or two of new  treatment.  You experience continued weight loss.  You have ongoing crampy digestion or loose bowels.  You develop a new skin rash, skin sores, or eye problems. SEEK IMMEDIATE MEDICAL CARE IF:   You have worsening of your symptoms or develop new symptoms.  You have a fever.  You develop bloody diarrhea.  You develop severe abdominal pain. MAKE SURE YOU:   Understand these instructions.  Will watch your condition.  Will get help right away if you are not doing well or get worse. Document Released: 03/25/2005 Document Revised: 09/07/2011 Document Reviewed: 02/21/2007 [facility] Patient Information 2014 [place], [place].

## 2012-12-27 ENCOUNTER — Telehealth: Payer: Self-pay

## 2012-12-27 NOTE — Telephone Encounter (Signed)
An appointment for Heather Bender has been scheduled with Dr. Octaviano Glow at [facility] on July 22nd @ 10:30 am. They have requested that records be faxed over to them [phone]. Notes have been faxed. The patient has been notified and given all information necessary for her appointment at this time. LB

## 2012-12-29 ENCOUNTER — Other Ambulatory Visit: Payer: Self-pay | Admitting: *Deleted

## 2012-12-29 DIAGNOSIS — E785 Hyperlipidemia, unspecified: Secondary | ICD-10-CM

## 2012-12-29 DIAGNOSIS — R5383 Other fatigue: Secondary | ICD-10-CM

## 2013-01-02 ENCOUNTER — Other Ambulatory Visit: Payer: Medicare Other

## 2013-01-02 LAB — CBC WITH DIFFERENTIAL/PLATELET
Basophils Absolute: 0 10*3/uL (ref 0.0–0.1)
Basophils Relative: 1 % (ref 0–1)
Eosinophils Absolute: 0.1 10*3/uL (ref 0.0–0.7)
Eosinophils Relative: 2 % (ref 0–5)
HCT: 34.4 % — ABNORMAL LOW (ref 36.0–46.0)
Hemoglobin: 12.2 g/dL (ref 12.0–15.0)
Lymphocytes Relative: 23 % (ref 12–46)
Lymphs Abs: 1.2 10*3/uL (ref 0.7–4.0)
MCH: 31.2 pg (ref 26.0–34.0)
MCHC: 35.5 g/dL (ref 30.0–36.0)
MCV: 88 fL (ref 78.0–100.0)
Monocytes Absolute: 0.5 10*3/uL (ref 0.1–1.0)
Monocytes Relative: 9 % (ref 3–12)
Neutro Abs: 3.6 10*3/uL (ref 1.7–7.7)
Neutrophils Relative %: 65 % (ref 43–77)
Platelets: 264 10*3/uL (ref 150–400)
RBC: 3.91 MIL/uL (ref 3.87–5.11)
RDW: 13.4 % (ref 11.5–15.5)
WBC: 5.4 10*3/uL (ref 4.0–10.5)

## 2013-01-02 LAB — COMPLETE METABOLIC PANEL WITH GFR
ALT: 9 U/L (ref 0–35)
AST: 12 U/L (ref 0–37)
Albumin: 4.5 g/dL (ref 3.5–5.2)
Alkaline Phosphatase: 48 U/L (ref 39–117)
BUN: 36 mg/dL — ABNORMAL HIGH (ref 6–23)
CO2: 30 mEq/L (ref 19–32)
Calcium: 9.4 mg/dL (ref 8.4–10.5)
Chloride: 104 mEq/L (ref 96–112)
Creat: 1.66 mg/dL — ABNORMAL HIGH (ref 0.50–1.10)
GFR, Est African American: 33 mL/min — ABNORMAL LOW
GFR, Est Non African American: 28 mL/min — ABNORMAL LOW
Glucose, Bld: 103 mg/dL — ABNORMAL HIGH (ref 70–99)
Potassium: 4.2 mEq/L (ref 3.5–5.3)
Sodium: 142 mEq/L (ref 135–145)
Total Bilirubin: 0.6 mg/dL (ref 0.3–1.2)
Total Protein: 6.5 g/dL (ref 6.0–8.3)

## 2013-01-02 LAB — LIPID PANEL
Cholesterol: 142 mg/dL (ref 0–200)
HDL: 47 mg/dL (ref >39)
LDL Cholesterol: 78 mg/dL (ref 0–99)
Total CHOL/HDL Ratio: 3 Ratio
Triglycerides: 86 mg/dL (ref <150)
VLDL: 17 mg/dL (ref 0–40)

## 2013-01-08 ENCOUNTER — Encounter: Payer: Self-pay | Admitting: Family Medicine

## 2013-01-08 DIAGNOSIS — R1013 Epigastric pain: Secondary | ICD-10-CM | POA: Insufficient documentation

## 2013-01-08 NOTE — Assessment & Plan Note (Signed)
She was given prescriptions for Protonix and a GI Cocktail.

## 2013-01-09 ENCOUNTER — Encounter: Payer: Self-pay | Admitting: Family Medicine

## 2013-01-09 ENCOUNTER — Ambulatory Visit (INDEPENDENT_AMBULATORY_CARE_PROVIDER_SITE_OTHER): Payer: Medicare Other | Admitting: Family Medicine

## 2013-01-09 VITALS — BP 119/70 | HR 75 | Wt 125.0 lb

## 2013-01-09 DIAGNOSIS — E785 Hyperlipidemia, unspecified: Secondary | ICD-10-CM

## 2013-01-09 DIAGNOSIS — R799 Abnormal finding of blood chemistry, unspecified: Secondary | ICD-10-CM

## 2013-01-09 DIAGNOSIS — I10 Essential (primary) hypertension: Secondary | ICD-10-CM

## 2013-01-09 DIAGNOSIS — R7989 Other specified abnormal findings of blood chemistry: Secondary | ICD-10-CM

## 2013-01-09 MED ORDER — ATORVASTATIN CALCIUM 20 MG PO TABS: ORAL_TABLET | ORAL | Status: DC

## 2013-01-09 MED ORDER — METOPROLOL SUCCINATE ER 50 MG PO TB24: 50.0000 mg | ORAL_TABLET | Freq: Every day | ORAL | Status: DC

## 2013-01-09 MED ORDER — LISINOPRIL 10 MG PO TABS: 10.0000 mg | ORAL_TABLET | Freq: Every day | ORAL | Status: DC

## 2013-01-09 NOTE — Progress Notes (Signed)
  Subjective:    Patient ID: Heather Bender, female    DOB: 1926/08/23, 79 y.o.   MRN: [id number]  HPI  Heather Bender is here today to go over her most recent lab results and to discuss the conditions listed below.   1)  Hypertension:  She is doing great on the combination of Lisinopril/HCTZ and metoprolol and needs both medications refilled.   2)  Hyperlipidemia:  She continues taking her atorvastatin 10 mg and needs a refill on it.   3)  GI Problems:  Her symptoms have improved since she started taking her pantoprazole.    Review of Systems  Constitutional: Negative for activity change, fatigue and unexpected weight change.  HENT: Negative.   Eyes: Negative.  Negative for visual disturbance.  Respiratory: Negative for shortness of breath.   Cardiovascular: Negative for chest pain, palpitations and leg swelling.  Gastrointestinal: Negative for nausea, abdominal pain, diarrhea and constipation.  Endocrine: Negative.   Genitourinary: Negative for difficulty urinating.  Musculoskeletal: Negative.   Skin: Negative.   Neurological: Negative.  Negative for light-headedness.  Hematological: Negative for adenopathy. Does not bruise/bleed easily.  Psychiatric/Behavioral: Negative for sleep disturbance and dysphoric mood. The patient is not nervous/anxious.     Past Medical History  Diagnosis Date  . Mitral prolapse   . Abdominal pain   . Hypertension   . Hyperlipidemia   . Hemorrhoids, internal, without mention of complications 2012    Noted during colonoscopy  . Diverticulosis of colon 2012     Noted during colonoscopy   . Hiatal hernia 2012    Noted during Gastroscopy  . GERD (gastroesophageal reflux disease) 2012    Negative for H. Pylori     Family History  Problem Relation Age of Onset  . Heart disease      No family history  . Cancer Father     Kidney  . Diabetes Sister   . Diabetes Maternal Aunt   . Cancer Maternal Grandmother     Colon Cancer      Objective:   Physical Exam  Constitutional: She appears well-nourished. No distress.  HENT:  Head: Normocephalic.  Eyes: No scleral icterus.  Neck: No thyromegaly present.  Cardiovascular: Normal rate, regular rhythm and normal heart sounds.   Pulmonary/Chest: Effort normal and breath sounds normal.  Abdominal: There is no tenderness.  Musculoskeletal: She exhibits no edema and no tenderness.  Neurological: She is alert.  Skin: Skin is warm and dry.  Psychiatric: She has a normal mood and affect. Her behavior is normal. Judgment and thought content normal.      Assessment & Plan:

## 2013-01-09 NOTE — Patient Instructions (Addendum)
1)  Blood Pressure - Take your current metoprolol twice a day till gone then take the Toprol XL 50 mg at night.  Change from the lisinopril HCT to plain lisinopril.  BP goal is <135/<90.    2)  Cholesterol - Perfect on current dosage so stay on 10 mg daily.    3)  Kidney Function - Stay well hydrated and try to limit the animal protein in your diet.  We'll recheck your kidney function in 3 months.  Limit NSAIDs (Ibuprofen/Aleve).  Be sure and drink a lot of water prior to your next blood draw.  We are checking a microalbumin today to see how much protein you have in your urine.       Chronic Kidney Disease Chronic kidney disease occurs when the kidneys are damaged over a long period. The kidneys are two organs that lie on either side of the spine between the middle of the back and the front of the abdomen. The kidneys:   Remove wastes and extra water from the blood.   Produce important hormones. These help keep bones strong, regulate blood pressure, and help create red blood cells.   Balance the fluids and chemicals in the blood and tissues. A small amount of kidney damage may not cause problems, but a large amount of damage may make it difficult or impossible for the kidneys to work the way they should. If steps are not taken to slow down the kidney damage or stop it from getting worse, the kidneys may stop working permanently. Most of the time, chronic kidney disease does not go away. However, it can often be controlled, and those with the disease can usually live normal lives. CAUSES  The most common causes of chronic kidney disease are diabetes and high blood pressure (hypertension). Chronic kidney disease may also be caused by:   Diseases that cause kidneys' filters to become inflamed.   Diseases that affect the immune system.   Genetic diseases.   Medicines that damage the kidneys, such as anti-inflammatory medicines.  Poisoning or exposure to toxic substances.   A  reoccurring kidney or urinary infection.   A problem with urine flow. This may be caused by:   Cancer.   Kidney stones.   An enlarged prostate in males. SYMPTOMS  Because the kidney damage in chronic kidney disease occurs slowly, symptoms develop slowly and may not be obvious until the kidney damage becomes severe. A person may have a kidney disease for years without showing any symptoms. Symptoms can include:   Swelling (edema) of the legs, ankles, or feet.   Tiredness (lethargy).   Nausea or vomiting.   Confusion.   Problems with urination, such as:   Decreased urine production.   Frequent urination, especially at night.   Frequent accidents in children who are potty trained.   Muscle twitches and cramps.   Shortness of breath.  Weakness.   Persistent itchiness.   Loss of appetite.  Metallic taste in the mouth.  Trouble sleeping.  Slowed development in children.  Short stature in children. DIAGNOSIS  Chronic kidney disease may be detected and diagnosed by tests, including blood, urine, imaging, or kidney biopsy tests.  TREATMENT  Most chronic kidney diseases cannot be cured. Treatment usually involves relieving symptoms and preventing or slowing the progression of the disease. Treatment may include:   A special diet. You may need to avoid alcohol and foods thatare salty and high in potassium.   Medicines. These may:   Lower blood  pressure.   Relieve anemia.   Relieve swelling.   Protect the bones. HOME CARE INSTRUCTIONS   Follow your prescribed diet.   Only take over-the-counter or prescription medicines as directed by your caregiver.  Do not take any new medicines (prescription, over-the-counter, or nutritional supplements) unless approved by your caregiver. Many medicines can worsen your kidney damage or need to have the dose adjusted.   Quit smoking if you are a smoker. Talk to your caregiver about a smoking cessation  program.   Keep all follow-up appointments as directed by your caregiver. SEEK IMMEDIATE MEDICAL CARE IF:  Your symptoms get worse or you develop new symptoms.   You develop symptoms of end-stage kidney disease. These include:   Headaches.   Abnormally dark or light skin.   Numbness in the hands or feet.   Easy bruising.   Frequent hiccups.   Menstruation stops.   You have a fever.   You have decreased urine production.   You havepain or bleeding when urinating. MAKE SURE YOU:  Understand these instructions.  Will watch your condition.  Will get help right away if you are not doing well or get worse. FOR MORE INFORMATION  American Association of Kidney Patients: ResidentialShow.is National Kidney Foundation: www.kidney.org American Kidney Fund: FightingMatch.com.ee Life Options Rehabilitation Program: www.lifeoptions.org and www.kidneyschool.org Document Released: 03/24/2008 Document Revised: 06/01/2012 Document Reviewed: 02/12/2012 [facility] Patient Information 2014 [place], [place].  Renal Diet, Pre-Dialysis Chronic kidney disease (CKD) is when the kidneys are not working the way they should. There are 5 stages of kidney disease. Your meal plan will depend on the stage you are diagnosed with. Food can make you healthier and keep your kidneys working well. Use this sheet to help you learn how to eat right to feel right.  A Registered Dietitian can help you with this meal plan. Write down your dietician's name and phone number. HOW DOES FOOD AFFECT MY KIDNEYS? Food gives you energy and helps your body repair itself. Food is broken down in your stomach and intestines. Your blood picks up nutrients from the digested food and carries them to all your body cells. These cells take nutrients from your blood and put waste products back into the bloodstream. When your kidneys were healthy, they constantly removed wastes from your blood. The wastes left your body when you urinated  or when you had bowel movements. Now that your kidneys cannot remove wastes from food, you will need to eat fewer foods that cause waste buildup in the body.  FLUIDS Most people with CKD do not need to restrict fluid. However, if you are retaining fluid in your legs, your caregiver may recommend restricting fluids. Ask and record the amount of fluid you can have each day if instructed by your caregiver. POTASSIUM  Potassium is a mineral found in many foods, especially milk, fruits, and vegetables. It affects how steadily your heart beats. Healthy kidneys keep the right amount of potassium in the blood to keep the heart beating at a steady pace. You may need to restrict potassium. Talk to your caregiver or dietitian to learn more about your potassium needs. If you do need to reduce the potassium in your diet, start by noting the high-potassium foods (below) that you now eat. A dietitian can help you add other foods to the list and tell you how much of the foods below to eat.  High-Potassium Foods  Apricots.  Brussels sprouts.  Dates.  Lima beans.  Oranges.  Prune juice.  Spinach.  Avocados.  Milk.  Figs.  Melons.  Peanuts.  Prunes.  Tomatoes.  Bananas.  Cantaloupe.  Kiwi fruit.  Nectarines.  Asparagus spears.  Raisins.  Winter squash.  Beets.  Clams.  Fruit.  Orange juice.  Potatoes.  Sardines.  Yogurt. PHOSPHORUS  Phosphorus is a mineral found in many foods. If you have too much phosphorus in your blood, it pulls calcium from your bones. Losing calcium will make your bones weak and likely to break. Also, too much phosphorus may make your skin itch. Avoid foods like milk and cheese, dried beans, peas, colas, nuts, and peanut butter, which are high in phosphorus.  Your needs will depend on your kidney's ability to use phosphorous. Your dietitian can tell you how much of these foods to eat. PROTEIN  It is important to follow a low-protein diet. A  lower protein diet will slow the rate of your kidney failure.  Protein helps you keep muscle and repair tissue. In your body, the protein you eat breaks down into a waste product called urea. If urea builds up in your blood, you can become very sick. Ask and record how many servings of meat, fish, chicken, or eggs you may eat per day. Your dietitian may give you a specific number of grams of protein to eat daily.One ounce of meat, fish, chicken, or 1 egg is equal to 7 g. A regular serving size is about the size of the palm of your hand or a deck of cards and is 3 oz in weight and 21 g of protein.  SODIUM  Sodium is found in salt and other foods. Most canned foods and frozen dinners contain large amounts of sodium.  Try to eat fresh foods that are naturally low in sodium. Look for products labeled "low sodium."  Do not use salt substitutes because they contain potassium. Talk to a dietitian about spices you can use to flavor your food. The dietitian can help you find spice blends without sodium or potassium. VITAMINS AND MINERALS   Take only the vitamins that your caregiver prescribes.  Vitamins and minerals may be missing from your diet because you need to avoid so many foods. Your caregiver may prescribe a vitamin and mineral supplement to meet your needs. Document Released: 09/05/2002 Document Revised: 09/07/2011 Document Reviewed: 09/12/2010 [facility] Patient Information 2014 [place], [place].

## 2013-01-10 LAB — MICROALBUMIN, URINE: Microalb, Ur: 1.14 mg/dL (ref 0.00–1.89)

## 2013-02-11 ENCOUNTER — Encounter: Payer: Self-pay | Admitting: Family Medicine

## 2013-02-11 DIAGNOSIS — R7989 Other specified abnormal findings of blood chemistry: Secondary | ICD-10-CM | POA: Insufficient documentation

## 2013-02-11 DIAGNOSIS — E785 Hyperlipidemia, unspecified: Secondary | ICD-10-CM | POA: Insufficient documentation

## 2013-02-11 DIAGNOSIS — I10 Essential (primary) hypertension: Secondary | ICD-10-CM | POA: Insufficient documentation

## 2013-02-11 NOTE — Assessment & Plan Note (Signed)
We're changing her from metoprolol BID to Toprol XL and she'll stay on lisinopril.

## 2013-02-11 NOTE — Assessment & Plan Note (Signed)
She was given a refill for atorvastatin.

## 2013-02-11 NOTE — Assessment & Plan Note (Addendum)
Her creatinine is elevated.  She is to limit her intake of NSAIDS.

## 2013-04-06 ENCOUNTER — Other Ambulatory Visit: Payer: Medicare Other

## 2013-04-11 ENCOUNTER — Ambulatory Visit: Payer: Medicare Other | Admitting: Family Medicine

## 2013-04-11 ENCOUNTER — Other Ambulatory Visit: Payer: Medicare Other

## 2013-04-16 ENCOUNTER — Other Ambulatory Visit: Payer: Self-pay | Admitting: Family Medicine

## 2013-04-17 ENCOUNTER — Other Ambulatory Visit: Payer: Self-pay | Admitting: *Deleted

## 2013-04-17 DIAGNOSIS — R5381 Other malaise: Secondary | ICD-10-CM

## 2013-04-18 ENCOUNTER — Other Ambulatory Visit: Payer: Medicare Other

## 2013-04-18 LAB — CBC WITH DIFFERENTIAL/PLATELET
Basophils Absolute: 0 10*3/uL (ref 0.0–0.1)
Basophils Relative: 1 % (ref 0–1)
Eosinophils Absolute: 0 10*3/uL (ref 0.0–0.7)
Eosinophils Relative: 1 % (ref 0–5)
HCT: 35.9 % — ABNORMAL LOW (ref 36.0–46.0)
Hemoglobin: 12.2 g/dL (ref 12.0–15.0)
Lymphocytes Relative: 33 % (ref 12–46)
Lymphs Abs: 1.1 10*3/uL (ref 0.7–4.0)
MCH: 30 pg (ref 26.0–34.0)
MCHC: 34 g/dL (ref 30.0–36.0)
MCV: 88.2 fL (ref 78.0–100.0)
Monocytes Absolute: 0.4 10*3/uL (ref 0.1–1.0)
Monocytes Relative: 11 % (ref 3–12)
Neutro Abs: 1.8 10*3/uL (ref 1.7–7.7)
Neutrophils Relative %: 54 % (ref 43–77)
Platelets: 197 10*3/uL (ref 150–400)
RBC: 4.07 MIL/uL (ref 3.87–5.11)
RDW: 12.2 % (ref 11.5–15.5)
WBC: 3.3 10*3/uL — ABNORMAL LOW (ref 4.0–10.5)

## 2013-04-18 LAB — COMPLETE METABOLIC PANEL WITH GFR
ALT: 11 U/L (ref 0–35)
AST: 16 U/L (ref 0–37)
Albumin: 3.9 g/dL (ref 3.5–5.2)
Alkaline Phosphatase: 54 U/L (ref 39–117)
BUN: 27 mg/dL — ABNORMAL HIGH (ref 6–23)
CO2: 30 mEq/L (ref 19–32)
Calcium: 9.1 mg/dL (ref 8.4–10.5)
Chloride: 103 mEq/L (ref 96–112)
Creat: 1.19 mg/dL — ABNORMAL HIGH (ref 0.50–1.10)
GFR, Est African American: 48 mL/min — ABNORMAL LOW
GFR, Est Non African American: 42 mL/min — ABNORMAL LOW
Glucose, Bld: 100 mg/dL — ABNORMAL HIGH (ref 70–99)
Potassium: 4.5 mEq/L (ref 3.5–5.3)
Sodium: 140 mEq/L (ref 135–145)
Total Bilirubin: 0.6 mg/dL (ref 0.3–1.2)
Total Protein: 6.1 g/dL (ref 6.0–8.3)

## 2013-04-24 ENCOUNTER — Ambulatory Visit (INDEPENDENT_AMBULATORY_CARE_PROVIDER_SITE_OTHER): Payer: Medicare Other | Admitting: Family Medicine

## 2013-04-24 ENCOUNTER — Encounter: Payer: Self-pay | Admitting: Family Medicine

## 2013-04-24 VITALS — BP 128/75 | HR 73 | Resp 16 | Wt 125.0 lb

## 2013-04-24 DIAGNOSIS — N183 Chronic kidney disease, stage 3 unspecified: Secondary | ICD-10-CM

## 2013-04-24 DIAGNOSIS — N189 Chronic kidney disease, unspecified: Secondary | ICD-10-CM | POA: Insufficient documentation

## 2013-04-24 DIAGNOSIS — Z23 Encounter for immunization: Secondary | ICD-10-CM | POA: Insufficient documentation

## 2013-04-24 DIAGNOSIS — K219 Gastro-esophageal reflux disease without esophagitis: Secondary | ICD-10-CM

## 2013-04-24 MED ORDER — PANTOPRAZOLE SODIUM 40 MG PO TBEC
40.0000 mg | DELAYED_RELEASE_TABLET | Freq: Every day | ORAL | Status: DC
Start: 2013-04-24 — End: 2013-10-23

## 2013-04-24 NOTE — Patient Instructions (Signed)
1)  Kidney Function - Your level has improved.  Continue to stay as hydrated as you can to keep your kidneys happy.  Limit sodium, limit OTC pain meds (Ibuprofen, Aleve, Tylenol) and limit animal protein.     Kidney Disease, Adult The kidneys are two organs that lie on either side of the spine between the middle of the back and the front of the abdomen. The kidneys:   Remove wastes and extra water from the blood.   Produce important hormones. These regulate blood pressure, help keep bones strong, and help create red blood cells.   Balance the fluids and chemicals in the blood and tissues. Kidney disease occurs when the kidneys are damaged. Kidney damage may be sudden (acute) or develop over a long period (chronic). A small amount of damage may not cause problems, but a large amount of damage may make it difficult or impossible for the kidneys to work the way they should. Early detection and treatment of kidney disease may prevent kidney damage from becoming permanent or getting worse. Some kidney diseases are curable, but most are not. Many people with kidney disease are able to control the disease and live a normal life.  TYPES OF KIDNEY DISEASE  Acute kidney injury.Acute kidney injury occurs when there is sudden damage to the kidneys.  Chronic kidney disease. Chronic kidney disease occurs when the kidneys are damaged over a long period.  End-stage kidney disease. End-stage kidney disease occurs when the kidneys are so damaged that they stop working. In end-stage kidney disease, the kidneys cannot get better. CAUSES Any condition, disease, or event that damages the kidneys may cause kidney disease. Acute kidney injury.  A problem with blood flow to the kidneys. This may be caused by:   Blood loss.   Heart disease.   Severe burns.   Liver disease.  Direct damage to the kidneys. This may be caused by:  Some medicines.   A kidney infection.   Poisoning or consuming toxic  substances.   A surgical wound.   A blow to the kidney area.   A problem with urine flow. This may be caused by:   Cancer.   Kidney stones.   An enlarged prostate. Chronic kidney disease. The most common causes of chronic kidney disease are diabetes and high blood pressure (hypertension). Chronic kidney disease may also be caused by:   Diseases that cause the filtering units of the kidneys to become inflamed.   Diseases that affect the immune system.   Genetic diseases.   Medicines that damage the kidneys, such as anti-inflammatory medicines.  Poisoning or exposure to toxic substances.   A reoccurring kidney or urinary infection.   A problem with urine flow. This may be caused by:  Cancer.   Kidney stones.   An enlarged prostate in males. End-stage kidney disease. This kidney disease usually occurs when a chronic kidney disease gets worse. It may also occur after acute kidney injury.  SYMPTOMS   Swelling (edema) of the legs, ankles, or feet.   Tiredness (lethargy).   Nausea or vomiting.   Confusion.   Problems with urination, such as:   Painful or burning feeling during urination.   Decreased urine production.  Bloody urine.   Frequent urination, especially at night.  Hypertension.  Muscle twitches and cramps.   Shortness of breath.   Persistent itchiness.   Loss of appetite.  Metallic taste in the mouth.   Weakness.   Seizures.   Chest pain or pressure.  Trouble sleeping.   Headaches.   Abnormally dark or light skin.   Numbness in the hands or feet.   Easy bruising.   Frequent hiccups.   Menstruation stops. Sometimes, no symptoms are present. DIAGNOSIS  Kidney disease may be detected and diagnosed by tests, including blood, urine, imaging, or kidney biopsy tests.  TREATMENT  Acute kidney injury. Treatment of acute kidney injury varies depending on the cause and severity of the kidney  damage. In mild cases, no treatment may be needed. The kidneys may heal on their own. If acute kidney injury is more severe, your caregiver will treat the cause of the kidney damage, help the kidneys heal, and prevent complications from occurring. Severe cases may require a procedure to remove toxic wastes from the body (dialysis) or surgery to repair kidney damage. Surgery may involve:   Repair of a torn kidney.   Removal of an obstruction.  Most of the time, you will need to stay overnight at the hospital.  Chronic kidney disease. Most chronic kidney diseases cannot be cured. Treatment usually involves relieving symptoms and preventing or slowing the progression of the disease. Treatment may include:   A special diet. You may need to avoid alcohol and foods that:   Have added salt.   Are high in potassium.   Are high in protein.   Medicines. These may:   Lower blood pressure.   Relieve anemia.   Relieve swelling.   Protect the bones.  End-stage kidney disease. End-stage kidney disease is life-threatening and must be treated immediately. There are two treatments for end-stage kidney disease:   Dialysis.   Receiving a new kidney (kidney transplant). Both of these treatments have serious risks and consequences. In addition to having dialysis or a kidney transplant, you may need to take medicines to control hypertension and cholesterol and to decrease phosphorus levels in your blood. LENGTH OF ILLNESS  Acute kidney injury.The length of this disease varies greatly from person to person. Exactly how long it lasts depends on the cause of the kidney damage. Acute kidney injury may develop into chronic kidney disease or end-stage kidney disease.  Chronic kidney disease. This disease usually lasts a lifetime. Chronic kidney disease may worsen over time to become end-stage kidney disease. The time it takes for end-stage kidney disease to develop varies from person to  person.  End-stage kidney disease. This disease lasts until a kidney transplant is performed. PREVENTION  Kidney disease can sometimes be prevented. If you have diabetes, hypertension, or any other condition that may lead to kidney disease, you should try to prevent kidney disease with:   An appropriate diet.  Medicine.  Lifestyle changes. FOR MORE INFORMATION  American Association of Kidney Patients: ResidentialShow.is  National Kidney Foundation: www.kidney.org  American Kidney Fund: FightingMatch.com.ee  Life Options Rehabilitation Program: www.lifeoptions.org and www.kidneyschool.org  Document Released: 06/15/2005 Document Revised: 06/01/2012 Document Reviewed: 02/12/2012 [facility] Patient Information 2014 [place], [place].

## 2013-04-24 NOTE — Assessment & Plan Note (Signed)
Refilled her Protonix.   

## 2013-04-24 NOTE — Assessment & Plan Note (Signed)
The patient confirmed that they are not allergic to eggs and have never had a bad reaction with the flu shot in the past.  The vaccination was given without difficulty.   

## 2013-04-24 NOTE — Progress Notes (Signed)
  Subjective:    Patient ID: Heather Bender, female    DOB: 08/07/1926, 79 y.o.   MRN: [id number]  HPI  Heather Bender is here today to go over her most recent lab results and to discuss the conditions listed below:   1)  Hypertension:  She continues to do well with her metoprolol 50 mg and lisinopril 10 mg.   2)  Hyperlipidemia: She is Atorvastatin 20 mg (1/2 pill daily) and needs a refill on it.    3)  GERD:  She needs a refill on her pantoprazole 40 mg.     Review of Systems  Constitutional: Negative.   HENT: Negative.   Eyes: Negative.   Respiratory: Negative.   Cardiovascular: Negative.   Gastrointestinal: Negative.   Endocrine: Negative.   Genitourinary: Negative.   Musculoskeletal: Negative.   Skin: Negative.   Allergic/Immunologic: Negative.   Neurological: Negative.   Hematological: Negative.   Psychiatric/Behavioral: Negative.     Past Medical History  Diagnosis Date  . Mitral prolapse   . Abdominal pain   . Hypertension   . Hyperlipidemia   . Hemorrhoids, internal, without mention of complications 2012    Noted during colonoscopy  . Diverticulosis of colon 2012     Noted during colonoscopy   . Hiatal hernia 2012    Noted during Gastroscopy  . GERD (gastroesophageal reflux disease) 2012    Negative for H. Pylori     Family History  Problem Relation Age of Onset  . Heart disease      No family history  . Cancer Father     Kidney  . Diabetes Sister   . Diabetes Maternal Aunt   . Cancer Maternal Grandmother     Colon Cancer    History   Social History Narrative   Marital Status: Widowed    Children:  Daughter Olegario Messier)  Sons Jeannett Senior and [place])    Pets: Cat (1)    Living Situation: Lives with her daughter Mariann Barter)    Occupation: Retired    Education: [employer]    Tobacco Use/Exposure:  None    Alcohol Use:  Occasional   Drug Use:  None   Diet:  Regular   Exercise:  None   Hobbies: [employer] and Reading.                       Objective:   Physical Exam  Vitals reviewed. Constitutional: She is oriented to person, place, and time. She appears well-developed and well-nourished.  Cardiovascular: Normal rate and regular rhythm.   Pulmonary/Chest: Effort normal and breath sounds normal.  Neurological: She is alert and oriented to person, place, and time.  Skin: Skin is warm and dry.  Psychiatric: She has a normal mood and affect.      Assessment & Plan:

## 2013-04-24 NOTE — Assessment & Plan Note (Signed)
Her creatinine/GFR have improved since her last check.  She was encouraged to do several things to put less stress on her kidneys.

## 2013-10-13 ENCOUNTER — Other Ambulatory Visit: Payer: Self-pay | Admitting: *Deleted

## 2013-10-13 DIAGNOSIS — E785 Hyperlipidemia, unspecified: Secondary | ICD-10-CM

## 2013-10-13 DIAGNOSIS — I10 Essential (primary) hypertension: Secondary | ICD-10-CM

## 2013-10-13 DIAGNOSIS — R5383 Other fatigue: Secondary | ICD-10-CM

## 2013-10-13 DIAGNOSIS — R5381 Other malaise: Secondary | ICD-10-CM

## 2013-10-16 ENCOUNTER — Other Ambulatory Visit: Payer: Medicare Other

## 2013-10-16 LAB — COMPLETE METABOLIC PANEL WITH GFR
ALT: 9 U/L (ref 0–35)
AST: 15 U/L (ref 0–37)
Albumin: 4.3 g/dL (ref 3.5–5.2)
Alkaline Phosphatase: 52 U/L (ref 39–117)
BUN: 27 mg/dL — ABNORMAL HIGH (ref 6–23)
CO2: 32 mEq/L (ref 19–32)
Calcium: 9.9 mg/dL (ref 8.4–10.5)
Chloride: 102 mEq/L (ref 96–112)
Creat: 1.09 mg/dL (ref 0.50–1.10)
GFR, Est African American: 54 mL/min — ABNORMAL LOW
GFR, Est Non African American: 47 mL/min — ABNORMAL LOW
Glucose, Bld: 97 mg/dL (ref 70–99)
Potassium: 4.5 mEq/L (ref 3.5–5.3)
Sodium: 138 mEq/L (ref 135–145)
Total Bilirubin: 0.6 mg/dL (ref 0.2–1.2)
Total Protein: 6.4 g/dL (ref 6.0–8.3)

## 2013-10-16 LAB — CBC WITH DIFFERENTIAL/PLATELET
Basophils Absolute: 0.1 10*3/uL (ref 0.0–0.1)
Basophils Relative: 1 % (ref 0–1)
Eosinophils Absolute: 0.1 10*3/uL (ref 0.0–0.7)
Eosinophils Relative: 2 % (ref 0–5)
HCT: 36.4 % (ref 36.0–46.0)
Hemoglobin: 12.9 g/dL (ref 12.0–15.0)
Lymphocytes Relative: 22 % (ref 12–46)
Lymphs Abs: 1.3 10*3/uL (ref 0.7–4.0)
MCH: 31.2 pg (ref 26.0–34.0)
MCHC: 35.4 g/dL (ref 30.0–36.0)
MCV: 88.1 fL (ref 78.0–100.0)
Monocytes Absolute: 0.5 10*3/uL (ref 0.1–1.0)
Monocytes Relative: 8 % (ref 3–12)
Neutro Abs: 4.1 10*3/uL (ref 1.7–7.7)
Neutrophils Relative %: 67 % (ref 43–77)
Platelets: 266 10*3/uL (ref 150–400)
RBC: 4.13 MIL/uL (ref 3.87–5.11)
RDW: 13.3 % (ref 11.5–15.5)
WBC: 6.1 10*3/uL (ref 4.0–10.5)

## 2013-10-16 LAB — LIPID PANEL
Cholesterol: 152 mg/dL (ref 0–200)
HDL: 49 mg/dL (ref >39)
LDL Cholesterol: 87 mg/dL (ref 0–99)
Total CHOL/HDL Ratio: 3.1 Ratio
Triglycerides: 82 mg/dL (ref <150)
VLDL: 16 mg/dL (ref 0–40)

## 2013-10-16 LAB — TSH: TSH: 2.072 u[IU]/mL (ref 0.350–4.500)

## 2013-10-23 ENCOUNTER — Ambulatory Visit (INDEPENDENT_AMBULATORY_CARE_PROVIDER_SITE_OTHER): Payer: Medicare Other | Admitting: Family Medicine

## 2013-10-23 ENCOUNTER — Encounter: Payer: Self-pay | Admitting: Family Medicine

## 2013-10-23 VITALS — BP 127/68 | HR 78 | Resp 16 | Wt 127.0 lb

## 2013-10-23 DIAGNOSIS — E785 Hyperlipidemia, unspecified: Secondary | ICD-10-CM

## 2013-10-23 DIAGNOSIS — I10 Essential (primary) hypertension: Secondary | ICD-10-CM

## 2013-10-23 DIAGNOSIS — K219 Gastro-esophageal reflux disease without esophagitis: Secondary | ICD-10-CM

## 2013-10-23 MED ORDER — LISINOPRIL 10 MG PO TABS: 10.0000 mg | ORAL_TABLET | Freq: Every day | ORAL | Status: AC

## 2013-10-23 MED ORDER — METOPROLOL SUCCINATE ER 50 MG PO TB24: 50.0000 mg | ORAL_TABLET | Freq: Every day | ORAL | Status: AC

## 2013-10-23 MED ORDER — ATORVASTATIN CALCIUM 20 MG PO TABS
ORAL_TABLET | ORAL | Status: AC
Start: 2013-10-23 — End: ?

## 2013-10-23 NOTE — Progress Notes (Signed)
Subjective:    Patient ID: Heather Bender, female    DOB: [birth year], 80 y.o.   MRN: [id number]  HPI   Heather Bender is here today to go over her most recent lab results. She also needs some of her medications refilled for a 90 day supply.   1)  Hypertension:  She is doing well with the combination of lisinopril (10mg  every day) and Metoprolol (50mg  daily).  She checks her blood pressure at home which continues to be normal.   2)  Hyperlipidemia: She is doing well on atorvastatin 20 mg and would like to remain on it.     Review of Systems  Constitutional: Negative for activity change, fatigue and unexpected weight change.  HENT: Negative.   Eyes: Negative.   Respiratory: Negative for shortness of breath.   Cardiovascular: Negative for chest pain, palpitations and leg swelling.  Gastrointestinal: Negative for diarrhea and constipation.  Endocrine: Negative.   Genitourinary: Negative for difficulty urinating.  Musculoskeletal: Negative.   Skin: Negative.   Neurological: Negative.   Hematological: Negative for adenopathy. Does not bruise/bleed easily.  Psychiatric/Behavioral: Negative for sleep disturbance and dysphoric mood. The patient is not nervous/anxious.      Past Medical History  Diagnosis Date  . Mitral prolapse   . Abdominal pain   . Hypertension   . Hyperlipidemia   . Hemorrhoids, internal, without mention of complications 2012    Noted during colonoscopy  . Diverticulosis of colon 2012     Noted during colonoscopy   . Hiatal hernia 2012    Noted during Gastroscopy  . GERD (gastroesophageal reflux disease) 2012    Negative for H. Pylori      Past Surgical History  Procedure Laterality Date  . Melanoma excision    . Ovary surgery    . Tonsillectomy    . Cholecystectomy  2013     History   Social History Narrative   Marital Status: Widowed    Children:  Daughter Olegario Messier(Kathy)  Sons Jeannett Senior([place] and [place])    Pets: Cat (1)    Living Situation: Lives with her  daughter Mariann Barter(Kathy Whitney)    Occupation: Retired    Education: [employer]    Tobacco Use/Exposure:  None    Alcohol Use:  Occasional   Drug Use:  None   Diet:  Regular   Exercise:  None   Hobbies: [employer] and Reading.                     Family History  Problem Relation Age of Onset  . Heart disease      No family history  . Cancer Father     Kidney  . Diabetes Sister   . Diabetes Maternal Aunt   . Cancer Maternal Grandmother     Colon Cancer     No current outpatient prescriptions on file prior to visit.   No current facility-administered medications on file prior to visit.     Allergies  Allergen Reactions  . Penicillins Anaphylaxis  . Sulfa Antibiotics Rash     Immunization History  Administered Date(s) Administered  . Influenza,inj,Quad PF,36+ Mos 04/24/2013  . Pneumococcal Conjugate-13 06/29/2008  . Tdap 06/29/2009  . Zoster 06/29/2009       Objective:   Physical Exam  Nursing note and vitals reviewed. Constitutional: She is oriented to person, place, and time.  Eyes: Conjunctivae are normal. No scleral icterus.  Neck: Neck supple. No thyromegaly present.  Cardiovascular: Normal rate,  regular rhythm and normal heart sounds.   Pulmonary/Chest: Effort normal and breath sounds normal.  Musculoskeletal: She exhibits no edema and no tenderness.  Lymphadenopathy:    She has no cervical adenopathy.  Neurological: She is alert and oriented to person, place, and time.  Skin: Skin is warm and dry.  Psychiatric: She has a normal mood and affect. Her behavior is normal. Judgment and thought content normal.       Assessment & Plan:    Heather Bender was seen today for medication management.  Diagnoses and associated orders for this visit:  Essential hypertension, benign - metoprolol succinate (TOPROL XL) 50 MG 24 hr tablet; Take 1 tablet (50 mg total) by mouth at bedtime. - lisinopril (PRINIVIL,ZESTRIL) 10 MG tablet; Take 1 tablet (10  mg total) by mouth daily.  Other and unspecified hyperlipidemia - atorvastatin (LIPITOR) 20 MG tablet; Take 1/2 - 1 tab po daily  GERD (gastroesophageal reflux disease)

## 2013-10-23 NOTE — Progress Notes (Deleted)
   Subjective:    Patient ID: Heather CitizenJoan M Epting, female    DOB: [birth year], 80 y.o.   MRN: [id number]  HPI    Review of Systems     Objective:   Physical Exam        Assessment & Plan:

## 2013-10-23 NOTE — Patient Instructions (Signed)
1)  Stomach Acid - If you need occasional acid relief try some Zantac (ranitidine) 150- 300 mg as needed along with your Tums.  You can also try 1 tsp of baking soda in 8 oz of water and Mylanta is also helpful.     Diet for Gastroesophageal Reflux Disease, Adult Reflux is when stomach acid flows up into the esophagus. The esophagus becomes irritated and sore (inflammation). When reflux happens often and is severe, it is called gastroesophageal reflux disease (GERD). What you eat can help ease any discomfort caused by GERD. FOODS OR DRINKS TO AVOID OR LIMIT  Coffee and black tea, with or without caffeine.  Bubbly (carbonated) drinks with caffeine or energy drinks.  Strong spices, such as pepper, cayenne pepper, curry, or chili powder.  Peppermint or spearmint.  Chocolate.  High-fat foods, such as meats, fried food, oils, butter, or nuts.  Fruits and vegetables that cause discomfort. This includes citrus fruits and tomatoes.  Alcohol. If a certain food or drink irritates your GERD, avoid eating or drinking it. THINGS THAT MAY HELP GERD INCLUDE:  Eat meals slowly.  Eat 5 to 6 small meals a day, not 3 large meals.  Do not eat food for a certain amount of time if it causes discomfort.  Wait 3 hours after eating before lying down.  Keep the head of your bed raised 6 to 9 inches (15 23 centimeters). Put a foam wedge or blocks under the legs of the bed.  Stay active. Weight loss, if needed, may help ease your discomfort.  Wear loose-fitting clothing.  Do not smoke or chew tobacco. Document Released: 12/15/2011 Document Reviewed: 12/15/2011 [facility] Patient Information 2014 [employer] [place].

## 2013-10-27 ENCOUNTER — Telehealth: Payer: Self-pay

## 2013-10-27 NOTE — Telephone Encounter (Signed)
Ms Heather HeckFerinand call wondering if she needed an appointment for labs the week before her CPE on 10.27.15.

## 2013-11-27 LAB — HM MAMMOGRAPHY: HM Mammogram: NORMAL

## 2013-11-30 ENCOUNTER — Encounter: Payer: Self-pay | Admitting: *Deleted

## 2014-04-16 ENCOUNTER — Emergency Department (HOSPITAL_BASED_OUTPATIENT_CLINIC_OR_DEPARTMENT_OTHER): Payer: Medicare Other

## 2014-04-16 ENCOUNTER — Encounter (HOSPITAL_BASED_OUTPATIENT_CLINIC_OR_DEPARTMENT_OTHER): Payer: Self-pay | Admitting: Emergency Medicine

## 2014-04-16 ENCOUNTER — Emergency Department (HOSPITAL_BASED_OUTPATIENT_CLINIC_OR_DEPARTMENT_OTHER)
Admission: EM | Admit: 2014-04-16 | Discharge: 2014-04-17 | Disposition: A | Discharge status: Home / Self Care | Payer: Medicare Other | Attending: Emergency Medicine | Admitting: Emergency Medicine

## 2014-04-16 DIAGNOSIS — Z8619 Personal history of other infectious and parasitic diseases: Secondary | ICD-10-CM | POA: Insufficient documentation

## 2014-04-16 DIAGNOSIS — Z9049 Acquired absence of other specified parts of digestive tract: Secondary | ICD-10-CM | POA: Diagnosis not present

## 2014-04-16 DIAGNOSIS — I10 Essential (primary) hypertension: Secondary | ICD-10-CM | POA: Insufficient documentation

## 2014-04-16 DIAGNOSIS — Z79899 Other long term (current) drug therapy: Secondary | ICD-10-CM | POA: Diagnosis not present

## 2014-04-16 DIAGNOSIS — K219 Gastro-esophageal reflux disease without esophagitis: Secondary | ICD-10-CM | POA: Insufficient documentation

## 2014-04-16 DIAGNOSIS — K859 Acute pancreatitis, unspecified: Secondary | ICD-10-CM | POA: Insufficient documentation

## 2014-04-16 DIAGNOSIS — Z7952 Long term (current) use of systemic steroids: Secondary | ICD-10-CM | POA: Insufficient documentation

## 2014-04-16 DIAGNOSIS — E785 Hyperlipidemia, unspecified: Secondary | ICD-10-CM | POA: Insufficient documentation

## 2014-04-16 DIAGNOSIS — Z880 Allergy status to penicillin: Secondary | ICD-10-CM | POA: Insufficient documentation

## 2014-04-16 DIAGNOSIS — R1084 Generalized abdominal pain: Secondary | ICD-10-CM | POA: Diagnosis present

## 2014-04-16 LAB — URINE MICROSCOPIC-ADD ON

## 2014-04-16 LAB — URINALYSIS, ROUTINE W REFLEX MICROSCOPIC
Bilirubin Urine: NEGATIVE
Glucose, UA: NEGATIVE mg/dL
Ketones, ur: 15 mg/dL — AB
Nitrite: NEGATIVE
Protein, ur: NEGATIVE mg/dL
Specific Gravity, Urine: 1.01 (ref 1.005–1.030)
Urobilinogen, UA: 0.2 mg/dL (ref 0.0–1.0)
pH: 6 (ref 5.0–8.0)

## 2014-04-16 LAB — COMPREHENSIVE METABOLIC PANEL
ALK PHOS: 61 U/L (ref 39–117)
ALT: 9 U/L (ref 0–35)
AST: 15 U/L (ref 0–37)
Albumin: 3.8 g/dL (ref 3.5–5.2)
Anion gap: 14 (ref 5–15)
BILIRUBIN TOTAL: 1 mg/dL (ref 0.3–1.2)
BUN: 20 mg/dL (ref 6–23)
CHLORIDE: 93 meq/L — AB (ref 96–112)
CO2: 29 meq/L (ref 19–32)
Calcium: 9.7 mg/dL (ref 8.4–10.5)
Creatinine, Ser: 1.2 mg/dL — ABNORMAL HIGH (ref 0.50–1.10)
GFR, EST AFRICAN AMERICAN: 46 mL/min — AB (ref >90)
GFR, EST NON AFRICAN AMERICAN: 40 mL/min — AB (ref >90)
GLUCOSE: 95 mg/dL (ref 70–99)
POTASSIUM: 4.3 meq/L (ref 3.7–5.3)
SODIUM: 136 meq/L — AB (ref 137–147)
Total Protein: 7.1 g/dL (ref 6.0–8.3)

## 2014-04-16 LAB — CBC WITH DIFFERENTIAL/PLATELET
Basophils Absolute: 0 10*3/uL (ref 0.0–0.1)
Basophils Relative: 0 % (ref 0–1)
Eosinophils Absolute: 0 10*3/uL (ref 0.0–0.7)
Eosinophils Relative: 0 % (ref 0–5)
HCT: 38.9 % (ref 36.0–46.0)
HEMOGLOBIN: 13.3 g/dL (ref 12.0–15.0)
LYMPHS ABS: 1 10*3/uL (ref 0.7–4.0)
LYMPHS PCT: 7 % — AB (ref 12–46)
MCH: 30.8 pg (ref 26.0–34.0)
MCHC: 34.2 g/dL (ref 30.0–36.0)
MCV: 90 fL (ref 78.0–100.0)
Monocytes Absolute: 1.5 10*3/uL — ABNORMAL HIGH (ref 0.1–1.0)
Monocytes Relative: 10 % (ref 3–12)
NEUTROS PCT: 83 % — AB (ref 43–77)
Neutro Abs: 12.8 10*3/uL — ABNORMAL HIGH (ref 1.7–7.7)
PLATELETS: 283 10*3/uL (ref 150–400)
RBC: 4.32 MIL/uL (ref 3.87–5.11)
RDW: 12.2 % (ref 11.5–15.5)
WBC: 15.3 10*3/uL — AB (ref 4.0–10.5)

## 2014-04-16 LAB — LIPASE, BLOOD: Lipase: 1372 U/L — ABNORMAL HIGH (ref 11–59)

## 2014-04-16 MED ORDER — IOHEXOL 300 MG/ML  SOLN
100.0000 mL | Freq: Once | INTRAMUSCULAR | Status: AC | PRN
  Administered 2014-04-16: 80 mL via INTRAVENOUS

## 2014-04-16 MED ORDER — SODIUM CHLORIDE 0.9 % IV SOLN
Freq: Once | INTRAVENOUS | Status: AC
  Administered 2014-04-16: 20:00:00 via INTRAVENOUS

## 2014-04-16 MED ORDER — ONDANSETRON HCL 4 MG/2ML IJ SOLN
4.0000 mg | Freq: Once | INTRAMUSCULAR | Status: AC
  Administered 2014-04-16: 4 mg via INTRAVENOUS
  Filled 2014-04-16: qty 2

## 2014-04-16 MED ORDER — HYDROMORPHONE HCL 1 MG/ML IJ SOLN
0.5000 mg | Freq: Once | INTRAMUSCULAR | Status: AC
  Administered 2014-04-16: 0.5 mg via INTRAVENOUS
  Filled 2014-04-16: qty 1

## 2014-04-16 MED ORDER — IOHEXOL 300 MG/ML  SOLN
25.0000 mL | Freq: Once | INTRAMUSCULAR | Status: AC | PRN
  Administered 2014-04-16: 25 mL via ORAL

## 2014-04-16 MED ORDER — SODIUM CHLORIDE 0.9 % IV BOLUS (SEPSIS)
1000.0000 mL | Freq: Once | INTRAVENOUS | Status: AC
  Administered 2014-04-16: 1000 mL via INTRAVENOUS

## 2014-04-16 NOTE — ED Notes (Signed)
Generalized abdominal pain since yesterday.   Some lower abdominal pain.   Some nausea/vomiting.  Some diarrhea yesterday.  No known fever.  No dysuria.

## 2014-04-16 NOTE — ED Provider Notes (Signed)
CSN: [id number]     Arrival date & time 04/16/14  1845 History  This chart was scribed for Mirian Mo, MD by Gwenyth Ober, ED Scribe. This patient was seen in room MH04/MH04 and the patient's care was started at 8:32 PM.   Chief Complaint  Patient presents with  . Abdominal Pain   The history is provided by the patient. No language interpreter was used.   HPI Comments: Heather Bender is a 80 y.o. female who presents to the Emergency Department complaining of recurrent, constant generalized abdominal pain that started yesterday. She states bloating last night, nausea, and loose stools as associated symptoms. Pt notes the pain is worse with walking. Pt denies fever, cough, congestion, sore throat, CP, SOB, dysuria, blood in urine or stool, vomiting and constipation. Pt was in ED 4 months ago for similar symptoms. Pt was on Prednisone for knee pain for 1 week prior to onset of symptoms, but has stopped treatment.    Past Medical History  Diagnosis Date  . Mitral prolapse   . Abdominal pain   . Hypertension   . Hyperlipidemia   . Hemorrhoids, internal, without mention of complications 2012    Noted during colonoscopy  . Diverticulosis of colon 2012     Noted during colonoscopy   . Hiatal hernia 2012    Noted during Gastroscopy  . GERD (gastroesophageal reflux disease) 2012    Negative for H. Pylori    Past Surgical History  Procedure Laterality Date  . Melanoma excision    . Ovary surgery    . Tonsillectomy    . Cholecystectomy  2013   Family History  Problem Relation Age of Onset  . Heart disease      No family history  . Cancer Father     Kidney  . Diabetes Sister   . Diabetes Maternal Aunt   . Cancer Maternal Grandmother     Colon Cancer   History  Substance Use Topics  . Smoking status: Never Smoker   . Smokeless tobacco: Never Used  . Alcohol Use: Yes     Comment: Occasional glass of wine   OB History   Grav Para Term Preterm Abortions TAB SAB Ect Mult  Living                 Review of Systems  Constitutional: Negative for fever.  HENT: Negative for congestion and sore throat.   Respiratory: Negative for cough and shortness of breath.   Cardiovascular: Negative for chest pain.  Gastrointestinal: Positive for nausea and abdominal pain. Negative for vomiting, constipation and blood in stool.  Genitourinary: Negative for dysuria and hematuria.  All other systems reviewed and are negative.  Allergies  Penicillins and Sulfa antibiotics  Home Medications   Prior to Admission medications   Medication Sig Start Date End Date Taking? Authorizing Provider  atorvastatin (LIPITOR) 20 MG tablet Take 1/2 - 1 tab po daily 10/23/13  Yes Gillian Scarce, MD  BIOTIN 5000 PO Take by mouth.   Yes Historical Provider, MD  Calcium Carbonate Antacid (TUMS ULTRA 1000 PO) Take by mouth 2 (two) times daily.   Yes Historical Provider, MD  Cholecalciferol (VITAMIN D3 PO) Take 1,000 Int'l Units by mouth.   Yes Historical Provider, MD  lisinopril (PRINIVIL,ZESTRIL) 10 MG tablet Take 1 tablet (10 mg total) by mouth daily. 10/23/13 10/23/14 Yes Gillian Scarce, MD  metoprolol succinate (TOPROL XL) 50 MG 24 hr tablet Take 1 tablet (50 mg total) by mouth  at bedtime. 10/23/13 10/23/14 Yes Gillian Scarceobyn K Zanard, MD  predniSONE (STERAPRED UNI-PAK) 10 MG tablet Take by mouth daily.   Yes Historical Provider, MD   BP 137/47  Pulse 95  Temp(Src) 98.3 F (36.8 C) (Oral)  Resp 18  Ht 5\' 3"  (1.6 m)  Wt 125 lb (56.7 kg)  BMI 22.15 kg/m2  SpO2 96% Physical Exam  Nursing note and vitals reviewed. Constitutional: She appears well-developed and well-nourished. No distress.  HENT:  Head: Normocephalic and atraumatic.  Eyes: Conjunctivae and EOM are normal.  Neck: Neck supple. No tracheal deviation present.  Cardiovascular: Normal rate, regular rhythm and normal heart sounds.   Pulmonary/Chest: Effort normal and breath sounds normal. No respiratory distress. She has no wheezes. She  has no rales.  Abdominal: There is tenderness.  Generalized abdominal tenderness  Skin: Skin is warm and dry.  Psychiatric: She has a normal mood and affect. Her behavior is normal.    ED Course  Procedures (including critical care time) DIAGNOSTIC STUDIES: Oxygen Saturation is 96% on RA, normal by my interpretation.    COORDINATION OF CARE: 8:38 PM Will order CT Abdomen. Discussed treatment plan with pt at bedside and pt agreed to plan.    Labs Review Labs Reviewed  CBC WITH DIFFERENTIAL - Abnormal; Notable for the following:    WBC 15.3 (*)    Neutrophils Relative % 83 (*)    Neutro Abs 12.8 (*)    Lymphocytes Relative 7 (*)    Monocytes Absolute 1.5 (*)    All other components within normal limits  COMPREHENSIVE METABOLIC PANEL - Abnormal; Notable for the following:    Sodium 136 (*)    Chloride 93 (*)    Creatinine, Ser 1.20 (*)    GFR calc non Af Amer 40 (*)    GFR calc Af Amer 46 (*)    All other components within normal limits  LIPASE, BLOOD - Abnormal; Notable for the following:    Lipase 1372 (*)    All other components within normal limits  URINALYSIS, ROUTINE W REFLEX MICROSCOPIC - Abnormal; Notable for the following:    Hgb urine dipstick TRACE (*)    Ketones, ur 15 (*)    Leukocytes, UA TRACE (*)    All other components within normal limits  URINE MICROSCOPIC-ADD ON  LACTIC ACID, PLASMA    Imaging Review Ct Abdomen Pelvis W Contrast  04/16/2014   CLINICAL DATA:  Bloating, nausea, and loose stools.  Abdominal pain.  EXAM: CT ABDOMEN AND PELVIS WITH CONTRAST  TECHNIQUE: Multidetector CT imaging of the abdomen and pelvis was performed using the standard protocol following bolus administration of intravenous contrast.  CONTRAST:  25mL OMNIPAQUE IOHEXOL 300 MG/ML SOLN, 80mL OMNIPAQUE IOHEXOL 300 MG/ML SOLN  COMPARISON:  None.  FINDINGS: Small subpleural nodules consistent with fat in the lung bases. Small esophageal hiatal hernia.  There is mild dilatation of  the pancreatic duct with mild infiltration in the peripancreatic fat suggesting early signs of acute pancreatitis. Normal enhancement of the pancreatic parenchyma suggesting no evidence of pancreatic necrosis. No loculated cystic collections are demonstrated. No bile duct dilatation. Tiny sub cm low-attenuation lesions are present in the liver, most likely representing small cysts or hemangiomas. Biliary hamartomas also possible. Gallbladder is not visualized likely surgically absent. Spleen, adrenal glands, abdominal aorta, inferior vena cava, and retroperitoneal lymph nodes are unremarkable. Multiple sub cm low-attenuation lesions in both kidneys, likely representing cysts. No solid mass or hydronephrosis in either kidney. Small bowel and colon  are not abnormally distended. Duodenum diverticulum. No free air or free fluid in the abdomen.  Pelvis: Appendix is normal. Uterus and ovaries are not enlarged. Bladder wall is not thickened. No free or loculated pelvic fluid collections. No evidence of diverticulitis. No pelvic mass or lymphadenopathy. Degenerative changes in the lumbar spine. Slight anterior subluxation of L4 on L5 is likely degenerative. No destructive bone lesions appreciated.  IMPRESSION: Mild pancreatic ductal dilatation with mild hazy infiltration in the peripancreatic fat suggesting early changes of pancreatitis. Vibratory correlation is suggested. Duodenum diverticulum. Probable small cysts in the liver and kidneys. Small esophageal hiatal hernia.   Electronically Signed   By: Burman NievesWilliam  Stevens M.D.   On: 04/16/2014 22:49     EKG Interpretation None      MDM   Final diagnoses:  None    80 y.o. female with pertinent PMH of chronic abd pain without known etiology presents with recurrent epigastric and bil UQ tenderness since yesterday.  No fevers, however has had nausea and vomiting with diarrhea.  No urinary symptoms.  On arrival vitals and physical exam as above.  Pt uncomfortable  appearing.  Labs obtained and as above with significant elevation in lipase.  CT scan with early pancreatitis.  Pt preferred admission to [facility].  Admitted in stable condition.    Pancreatitis      Mirian MoMatthew Nioma Mccubbins, MD 04/16/14 [id number]

## 2014-04-16 NOTE — ED Notes (Signed)
Lactic acid results given to Dr Littie DeedsGentry.

## 2014-04-17 DIAGNOSIS — K859 Acute pancreatitis, unspecified: Secondary | ICD-10-CM | POA: Diagnosis not present

## 2014-04-17 LAB — I-STAT CG4 LACTIC ACID, ED: Lactic Acid, Venous: 1.65 mmol/L (ref 0.5–2.2)

## 2014-04-24 ENCOUNTER — Encounter: Payer: Medicare Other | Admitting: Family Medicine

## 2015-04-28 IMAGING — CT CT ABD-PELV W/ CM
2 of 5 series · 15 of 46 positions shown, 17 images · IV contrast (APPLIED)
Comparison: None.

CLINICAL DATA: Bloating, nausea, and loose stools.  Abdominal pain.

EXAM:
CT ABDOMEN AND PELVIS WITH CONTRAST
TECHNIQUE: Multidetector CT imaging of the abdomen and pelvis was performed
using the standard protocol following bolus administration of
intravenous contrast.
CONTRAST:  25mL OMNIPAQUE IOHEXOL 300 MG/ML SOLN, 80mL OMNIPAQUE
IOHEXOL 300 MG/ML SOLN

[Series 2: abd/pelvis 5.0 b31f · axial · 0.67mm/px · z∈[+715,+1115]mm · 12 of 92 slices shown, 14 images]
[im 6/92  soft-tissue]
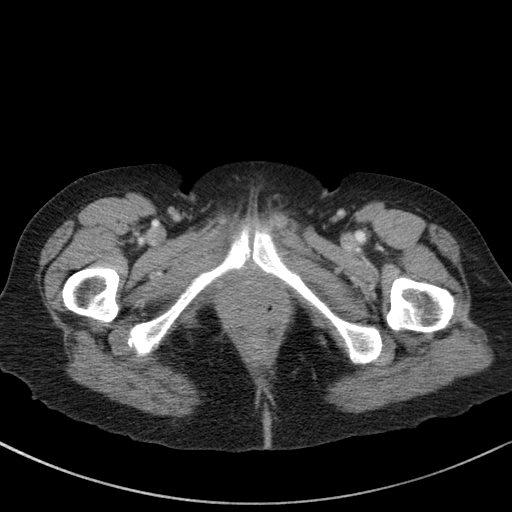
[im 6/92  bone]
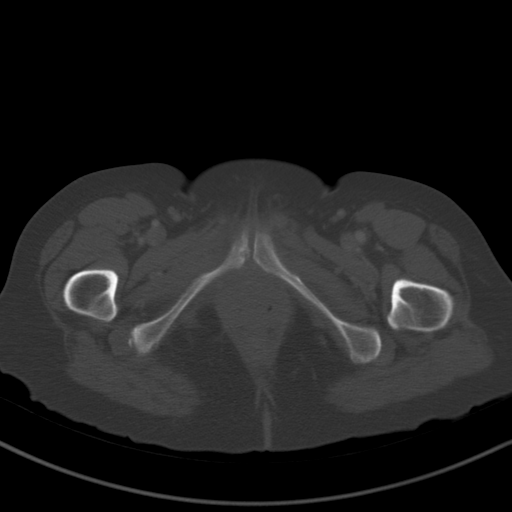
[im 16/92  soft-tissue]
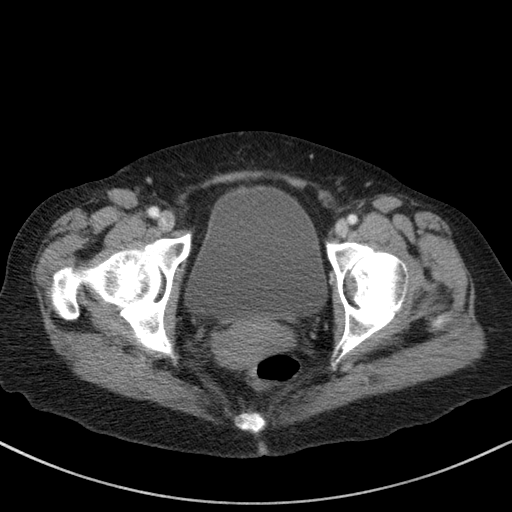
[im 21/92  soft-tissue]
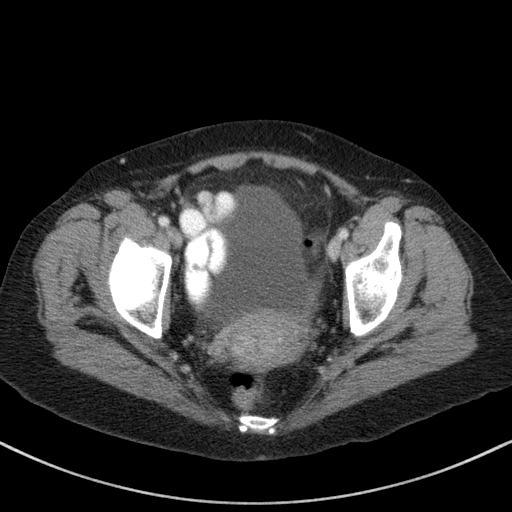
[im 26/92  soft-tissue]
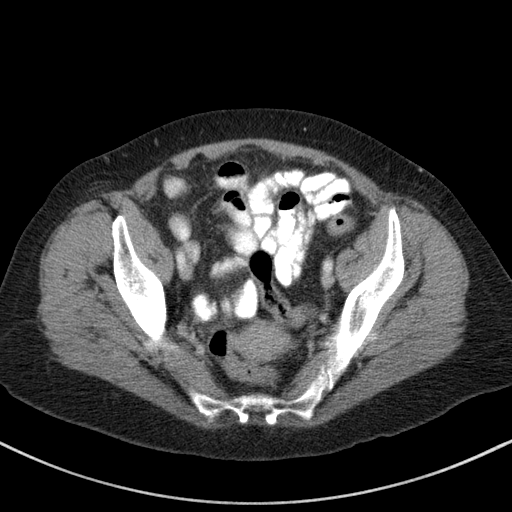
[im 36/92  soft-tissue]
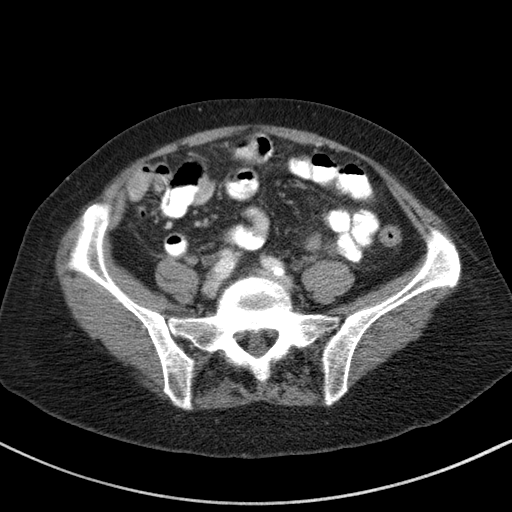
[im 41/92  soft-tissue]
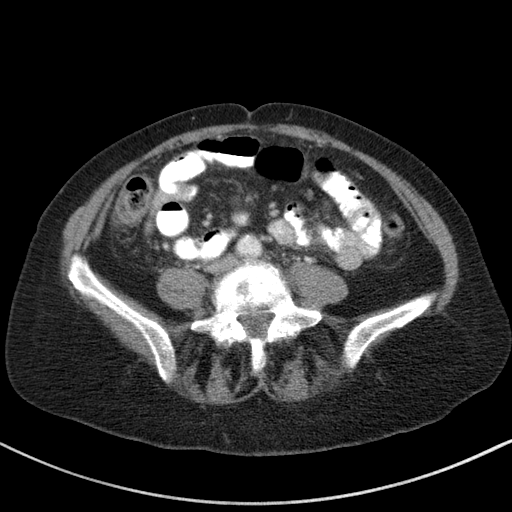
[im 51/92  soft-tissue]
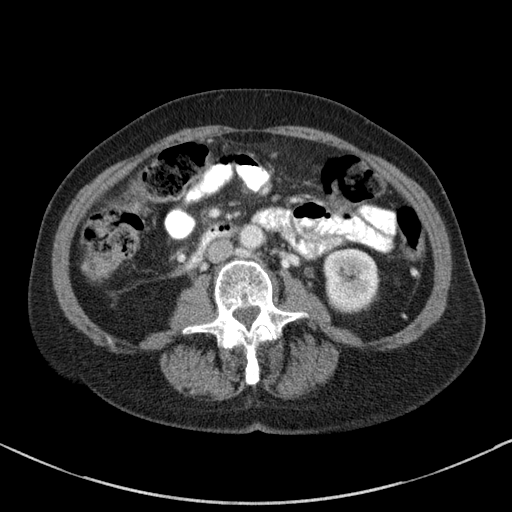
[im 56/92  soft-tissue]
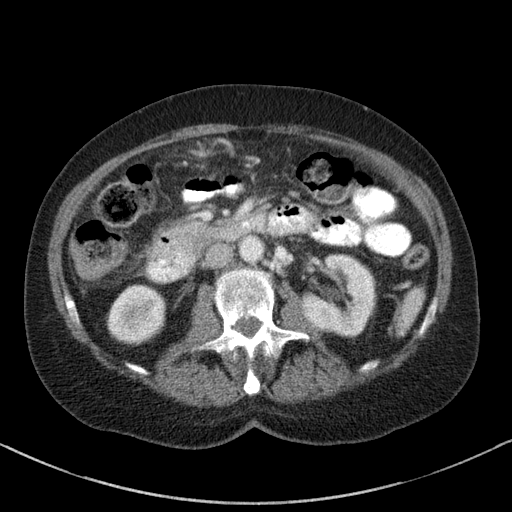
[im 66/92  soft-tissue]
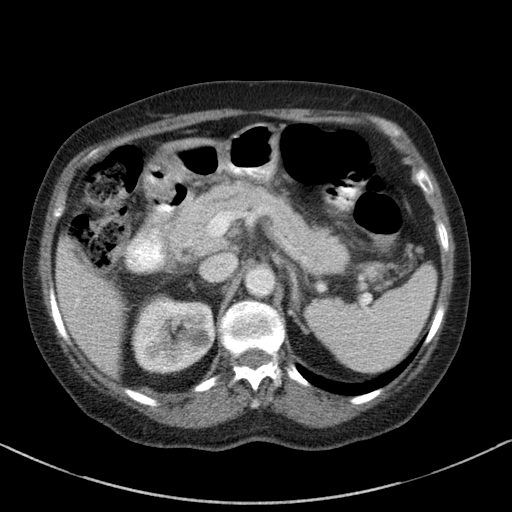
[im 66/92  bone]
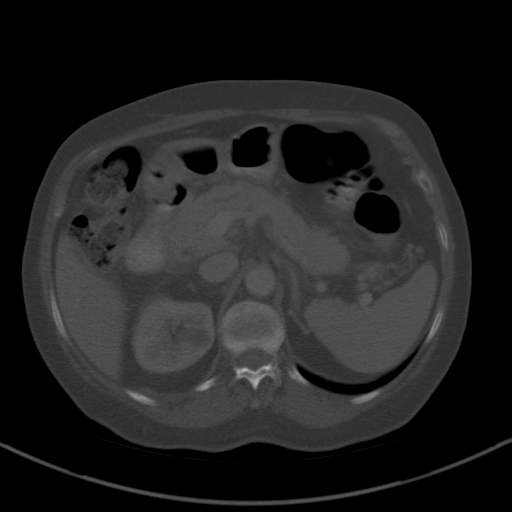
[im 71/92  soft-tissue]
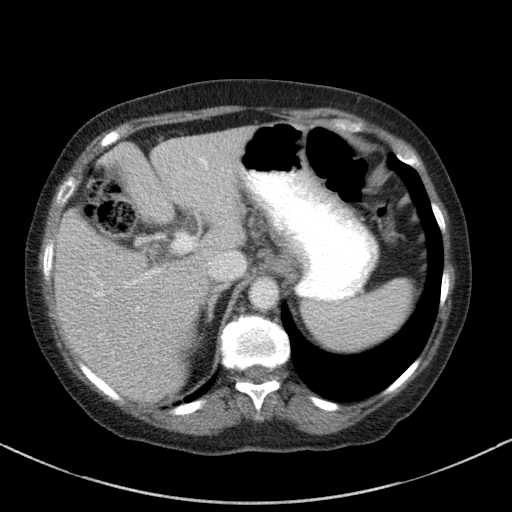
[im 76/92  soft-tissue]
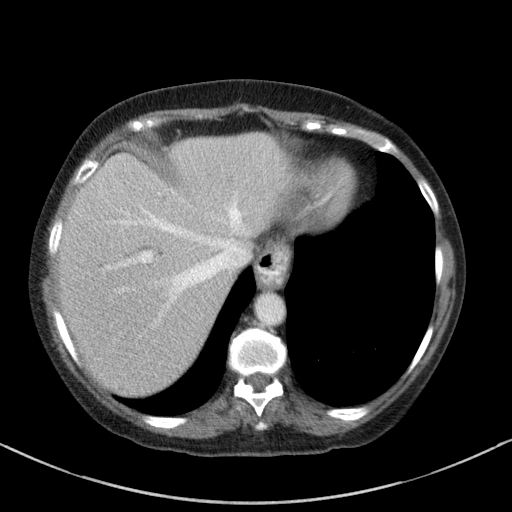
[im 86/92  soft-tissue]
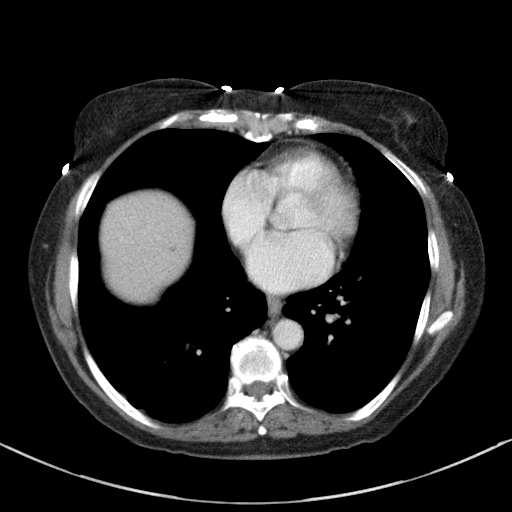

[Series 5: abd/pelvis 3.0 coronal · coronal · 0.66mm/px · 3 of 84 slices shown]
[im 28/84  soft-tissue]
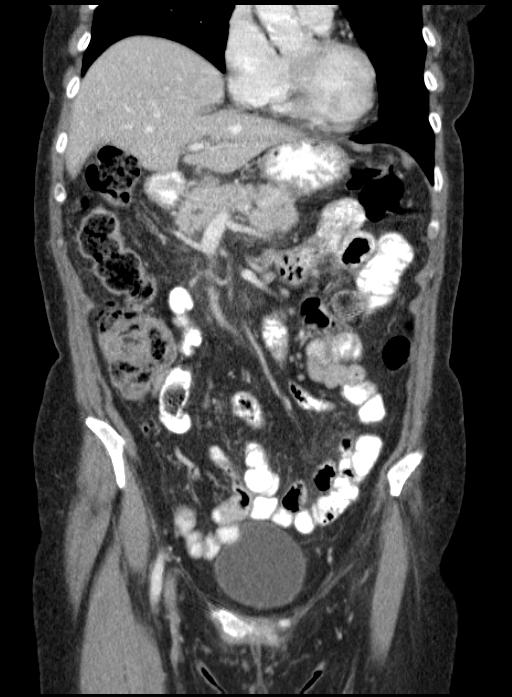
[im 37/84  soft-tissue]
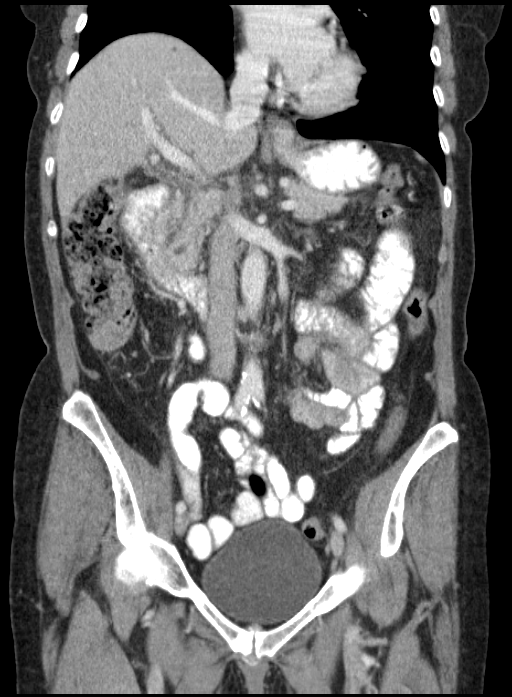
[im 47/84  soft-tissue]
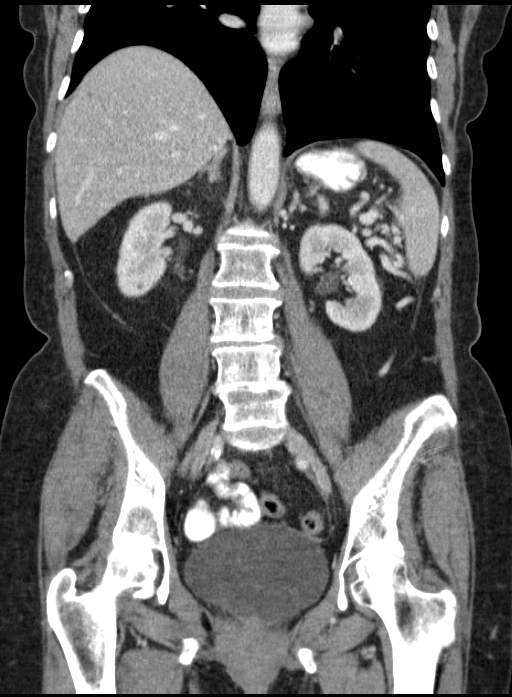

[15 of 46 positions shown; findings below may reference images not displayed]

FINDINGS: Small subpleural nodules consistent with fat in the lung bases.
Small esophageal hiatal hernia.

There is mild dilatation of the pancreatic duct with mild
infiltration in the peripancreatic fat suggesting early signs of
acute pancreatitis. Normal enhancement of the pancreatic parenchyma
suggesting no evidence of pancreatic necrosis. No loculated cystic
collections are demonstrated. No bile duct dilatation. Tiny sub cm
low-attenuation lesions are present in the liver, most likely
representing small cysts or hemangiomas. Biliary hamartomas also
possible. Gallbladder is not visualized likely surgically absent.
Spleen, adrenal glands, abdominal aorta, inferior vena cava, and
retroperitoneal lymph nodes are unremarkable. Multiple sub cm
low-attenuation lesions in both kidneys, likely representing cysts.
No solid mass or hydronephrosis in either kidney. Small bowel and
colon are not abnormally distended. Duodenum diverticulum. No free
air or free fluid in the abdomen.

Pelvis: Appendix is normal. Uterus and ovaries are not enlarged.
Bladder wall is not thickened. No free or loculated pelvic fluid
collections. No evidence of diverticulitis. No pelvic mass or
lymphadenopathy. Degenerative changes in the lumbar spine. Slight
anterior subluxation of L4 on L5 is likely degenerative. No
destructive bone lesions appreciated.
IMPRESSION: Mild pancreatic ductal dilatation with mild hazy infiltration in the
peripancreatic fat suggesting early changes of pancreatitis.
Vibratory correlation is suggested. Duodenum diverticulum. Probable
small cysts in the liver and kidneys. Small esophageal hiatal
hernia.
